# Patient Record
Sex: Female | Born: 1955
Health system: Southern US, Community
[De-identification: ages and names within clinical notes are randomized; demographics above are authoritative.]

## PROBLEM LIST (undated history)

## (undated) DIAGNOSIS — I10 Essential (primary) hypertension: Secondary | ICD-10-CM

## (undated) DIAGNOSIS — G4730 Sleep apnea, unspecified: Secondary | ICD-10-CM

## (undated) DIAGNOSIS — J309 Allergic rhinitis, unspecified: Secondary | ICD-10-CM

## (undated) DIAGNOSIS — R9431 Abnormal electrocardiogram [ECG] [EKG]: Secondary | ICD-10-CM

## (undated) DIAGNOSIS — E781 Pure hyperglyceridemia: Secondary | ICD-10-CM

## (undated) DIAGNOSIS — M129 Arthropathy, unspecified: Secondary | ICD-10-CM

## (undated) DIAGNOSIS — E119 Type 2 diabetes mellitus without complications: Secondary | ICD-10-CM

## (undated) HISTORY — DX: Sleep apnea, unspecified: G47.30

## (undated) HISTORY — DX: Pure hyperglyceridemia: E78.1

## (undated) HISTORY — PX: NASAL POLYP SURGERY: SHX186

## (undated) HISTORY — DX: Essential (primary) hypertension: I10

## (undated) HISTORY — DX: Arthropathy, unspecified: M12.9

## (undated) HISTORY — DX: Type 2 diabetes mellitus without complications: E11.9

## (undated) HISTORY — PX: HAND TENDON SURGERY: SHX663

## (undated) HISTORY — DX: Allergic rhinitis, unspecified: J30.9

## (undated) HISTORY — PX: ABDOMINAL HYSTERECTOMY: SHX81

## (undated) HISTORY — DX: Abnormal electrocardiogram (ECG) (EKG): R94.31

---

## 1999-05-19 ENCOUNTER — Other Ambulatory Visit: Admission: RE | Admit: 1999-05-19 | Discharge: 1999-05-19 | Payer: Self-pay | Admitting: Obstetrics and Gynecology

## 1999-05-24 ENCOUNTER — Ambulatory Visit (HOSPITAL_COMMUNITY): Admission: RE | Admit: 1999-05-24 | Discharge: 1999-05-24 | Payer: Self-pay | Admitting: Obstetrics and Gynecology

## 1999-05-24 ENCOUNTER — Encounter: Payer: Self-pay | Admitting: Obstetrics and Gynecology

## 1999-07-03 ENCOUNTER — Encounter: Payer: Self-pay | Admitting: Obstetrics and Gynecology

## 1999-07-03 ENCOUNTER — Ambulatory Visit (HOSPITAL_COMMUNITY): Admission: RE | Admit: 1999-07-03 | Discharge: 1999-07-03 | Payer: Self-pay | Admitting: Obstetrics and Gynecology

## 1999-08-14 ENCOUNTER — Encounter: Payer: Self-pay | Admitting: Obstetrics and Gynecology

## 1999-08-14 ENCOUNTER — Ambulatory Visit (HOSPITAL_COMMUNITY): Admission: RE | Admit: 1999-08-14 | Discharge: 1999-08-14 | Payer: Self-pay | Admitting: Obstetrics and Gynecology

## 1999-12-22 ENCOUNTER — Other Ambulatory Visit: Admission: RE | Admit: 1999-12-22 | Discharge: 1999-12-22 | Payer: Self-pay | Admitting: Obstetrics and Gynecology

## 2000-07-03 ENCOUNTER — Other Ambulatory Visit: Admission: RE | Admit: 2000-07-03 | Discharge: 2000-07-03 | Payer: Self-pay | Admitting: Obstetrics and Gynecology

## 2000-07-12 ENCOUNTER — Encounter: Payer: Self-pay | Admitting: Obstetrics and Gynecology

## 2000-07-12 ENCOUNTER — Ambulatory Visit (HOSPITAL_COMMUNITY): Admission: RE | Admit: 2000-07-12 | Discharge: 2000-07-12 | Payer: Self-pay | Admitting: Obstetrics and Gynecology

## 2001-07-04 ENCOUNTER — Other Ambulatory Visit: Admission: RE | Admit: 2001-07-04 | Discharge: 2001-07-04 | Payer: Self-pay | Admitting: Obstetrics and Gynecology

## 2001-07-11 ENCOUNTER — Encounter: Payer: Self-pay | Admitting: Obstetrics and Gynecology

## 2001-07-11 ENCOUNTER — Ambulatory Visit (HOSPITAL_COMMUNITY): Admission: RE | Admit: 2001-07-11 | Discharge: 2001-07-11 | Payer: Self-pay | Admitting: Obstetrics and Gynecology

## 2001-07-23 ENCOUNTER — Encounter (INDEPENDENT_AMBULATORY_CARE_PROVIDER_SITE_OTHER): Payer: Self-pay

## 2001-07-23 ENCOUNTER — Observation Stay (HOSPITAL_COMMUNITY): Admission: RE | Admit: 2001-07-23 | Discharge: 2001-07-24 | Payer: Self-pay | Admitting: Obstetrics and Gynecology

## 2003-09-14 ENCOUNTER — Emergency Department (HOSPITAL_COMMUNITY): Admission: EM | Admit: 2003-09-14 | Discharge: 2003-09-14 | Payer: Self-pay | Admitting: Emergency Medicine

## 2006-04-14 ENCOUNTER — Emergency Department (HOSPITAL_COMMUNITY): Admission: EM | Admit: 2006-04-14 | Discharge: 2006-04-14 | Payer: Self-pay | Admitting: Emergency Medicine

## 2006-08-13 ENCOUNTER — Encounter: Payer: Self-pay | Admitting: Internal Medicine

## 2006-08-13 LAB — CONVERTED CEMR LAB
ALT: 29 units/L
Albumin: 4.5 g/dL
BUN: 10 mg/dL
Calcium: 9.6 mg/dL
Cholesterol: 231 mg/dL
Creatinine, Ser: 0.82 mg/dL
Glucose, Bld: 157 mg/dL
HCT: 42.3 %
Hemoglobin: 14.3 g/dL
MCV: 94.2 fL
Monocytes Relative: 8 %
Potassium: 4.2 meq/L
RBC: 4.49 M/uL
RDW: 14.8 %
Total Bilirubin: 0.4 mg/dL
Total Protein: 7.3 g/dL
Triglyceride fasting, serum: 247 mg/dL

## 2006-08-22 ENCOUNTER — Encounter: Payer: Self-pay | Admitting: Internal Medicine

## 2007-01-06 ENCOUNTER — Emergency Department (HOSPITAL_COMMUNITY): Admission: EM | Admit: 2007-01-06 | Discharge: 2007-01-07 | Payer: Self-pay | Admitting: Emergency Medicine

## 2007-01-27 ENCOUNTER — Encounter: Payer: Self-pay | Admitting: Internal Medicine

## 2007-03-07 ENCOUNTER — Emergency Department (HOSPITAL_COMMUNITY): Admission: EM | Admit: 2007-03-07 | Discharge: 2007-03-07 | Payer: Self-pay | Admitting: Emergency Medicine

## 2007-05-01 LAB — HM MAMMOGRAPHY

## 2007-06-02 ENCOUNTER — Encounter: Admission: RE | Admit: 2007-06-02 | Discharge: 2007-06-02 | Payer: Self-pay | Admitting: Occupational Medicine

## 2008-07-15 ENCOUNTER — Encounter: Payer: Self-pay | Admitting: Internal Medicine

## 2008-07-15 LAB — CONVERTED CEMR LAB
AST: 17 units/L
Alkaline Phosphatase: 67 units/L
Basophils Relative: 0 %
CO2: 24 meq/L
Chloride: 106 meq/L
Creatinine, Ser: 0.85 mg/dL
HDL: 52 mg/dL
Hgb A1c MFr Bld: 5.6 %
LDL Cholesterol: 58 mg/dL
Lymphocytes, automated: 30 %
MCV: 97.5 fL
Neutrophils Relative %: 58 %
Platelets: 305 10*3/uL
Potassium: 4.1 meq/L
RBC: 3.98 M/uL
RDW: 13.9 %
Sodium: 142 meq/L
TSH: 1.151 microintl units/mL
Triglyceride fasting, serum: 251 mg/dL
WBC: 13.6 10*3/uL

## 2008-08-20 ENCOUNTER — Encounter: Payer: Self-pay | Admitting: Internal Medicine

## 2008-08-20 LAB — HM COLONOSCOPY

## 2010-02-04 ENCOUNTER — Observation Stay (HOSPITAL_COMMUNITY): Admission: EM | Admit: 2010-02-04 | Discharge: 2010-02-06 | Payer: Self-pay | Admitting: Emergency Medicine

## 2010-02-04 ENCOUNTER — Ambulatory Visit: Payer: Self-pay | Admitting: Internal Medicine

## 2010-02-04 DIAGNOSIS — R079 Chest pain, unspecified: Secondary | ICD-10-CM | POA: Insufficient documentation

## 2010-02-05 ENCOUNTER — Encounter: Payer: Self-pay | Admitting: Internal Medicine

## 2010-02-05 LAB — CONVERTED CEMR LAB
HDL: 36 mg/dL
Hgb A1c MFr Bld: 6.5 %
LDL Cholesterol: 54 mg/dL
Triglyceride fasting, serum: 392 mg/dL

## 2010-02-06 ENCOUNTER — Encounter: Payer: Self-pay | Admitting: Internal Medicine

## 2010-02-06 LAB — CONVERTED CEMR LAB
BUN: 15 mg/dL
Calcium: 9 mg/dL
Glucose, Bld: 145 mg/dL
Platelets: 285 10*3/uL
Potassium: 3.7 meq/L
RDW: 14.5 %
WBC: 10.7 10*3/uL

## 2010-02-07 ENCOUNTER — Encounter: Payer: Self-pay | Admitting: Internal Medicine

## 2010-02-07 ENCOUNTER — Ambulatory Visit: Payer: Self-pay | Admitting: Internal Medicine

## 2010-02-07 ENCOUNTER — Ambulatory Visit: Payer: Self-pay

## 2010-02-07 ENCOUNTER — Ambulatory Visit (HOSPITAL_COMMUNITY): Admission: RE | Admit: 2010-02-07 | Discharge: 2010-02-07 | Payer: Self-pay | Admitting: Internal Medicine

## 2010-02-07 ENCOUNTER — Telehealth (INDEPENDENT_AMBULATORY_CARE_PROVIDER_SITE_OTHER): Payer: Self-pay | Admitting: Radiology

## 2010-02-07 DIAGNOSIS — I70219 Atherosclerosis of native arteries of extremities with intermittent claudication, unspecified extremity: Secondary | ICD-10-CM | POA: Insufficient documentation

## 2010-02-08 ENCOUNTER — Ambulatory Visit: Payer: Self-pay | Admitting: Cardiovascular Disease

## 2010-02-08 ENCOUNTER — Ambulatory Visit: Payer: Self-pay

## 2010-02-08 ENCOUNTER — Encounter: Payer: Self-pay | Admitting: Internal Medicine

## 2010-02-08 ENCOUNTER — Encounter: Payer: Self-pay | Admitting: Cardiovascular Disease

## 2010-02-08 ENCOUNTER — Encounter (HOSPITAL_COMMUNITY)
Admission: RE | Admit: 2010-02-08 | Discharge: 2010-02-24 | Payer: Self-pay | Source: Home / Self Care | Admitting: Internal Medicine

## 2010-02-08 DIAGNOSIS — G4730 Sleep apnea, unspecified: Secondary | ICD-10-CM | POA: Insufficient documentation

## 2010-02-09 ENCOUNTER — Encounter: Payer: Self-pay | Admitting: Internal Medicine

## 2010-02-09 ENCOUNTER — Telehealth: Payer: Self-pay | Admitting: Internal Medicine

## 2010-02-22 ENCOUNTER — Ambulatory Visit: Payer: Self-pay | Admitting: Internal Medicine

## 2010-02-23 ENCOUNTER — Encounter: Payer: Self-pay | Admitting: Internal Medicine

## 2010-02-23 DIAGNOSIS — E1165 Type 2 diabetes mellitus with hyperglycemia: Secondary | ICD-10-CM | POA: Insufficient documentation

## 2010-02-28 ENCOUNTER — Ambulatory Visit: Payer: Self-pay | Admitting: Internal Medicine

## 2010-02-28 DIAGNOSIS — E781 Pure hyperglyceridemia: Secondary | ICD-10-CM | POA: Insufficient documentation

## 2010-02-28 DIAGNOSIS — I10 Essential (primary) hypertension: Secondary | ICD-10-CM | POA: Insufficient documentation

## 2010-02-28 DIAGNOSIS — J309 Allergic rhinitis, unspecified: Secondary | ICD-10-CM | POA: Insufficient documentation

## 2010-02-28 DIAGNOSIS — F17200 Nicotine dependence, unspecified, uncomplicated: Secondary | ICD-10-CM | POA: Insufficient documentation

## 2010-02-28 DIAGNOSIS — M1990 Unspecified osteoarthritis, unspecified site: Secondary | ICD-10-CM | POA: Insufficient documentation

## 2010-03-30 ENCOUNTER — Encounter: Payer: Self-pay | Admitting: Internal Medicine

## 2010-05-29 ENCOUNTER — Ambulatory Visit
Admission: RE | Admit: 2010-05-29 | Discharge: 2010-05-29 | Payer: Self-pay | Source: Home / Self Care | Attending: Internal Medicine | Admitting: Internal Medicine

## 2010-05-29 ENCOUNTER — Encounter (INDEPENDENT_AMBULATORY_CARE_PROVIDER_SITE_OTHER): Payer: Self-pay | Admitting: *Deleted

## 2010-05-29 DIAGNOSIS — F419 Anxiety disorder, unspecified: Secondary | ICD-10-CM | POA: Insufficient documentation

## 2010-05-30 NOTE — Progress Notes (Signed)
Summary: calling re rtn to work note  Phone Note Call from Patient   Caller: Patient  [phone] Reason for Call: Talk to Nurse Summary of Call: pt calling re note to rtn to work, when to go back? Needs faxed [phone]  att stephanie rogers Initial call taken by: Glynda Jaeger,  February 09, 2010 8:19 AM  Follow-up for Phone Call        pt adv she has note from hospital stay releasing back to work on 10.12.2011. however, she was here all day for stress test. will fax note to her work stating pt was here att.  Follow-up by: Claris Gladden RN,  February 09, 2010 9:59 AM

## 2010-05-30 NOTE — Assessment & Plan Note (Signed)
Summary: Cardiology Nuclear Testing  Nuclear Med Background Indications for Stress Test: Evaluation for Ischemia, Post Hospital, Abnormal EKG  Indications Comments: D/C 02/06/10 [facility] CP/EKG changes (-) enzymes   History Comments: D/C 02/06/10 [facility]  Symptoms: Chest Pressure, DOE, Fatigue, Palpitations, SOB    Nuclear Pre-Procedure Cardiac Risk Factors: Claudication, Family History - CAD, Hypertension, Lipids, NIDDM, PVD, Smoker Caffeine/Decaff Intake: None NPO After: 7:30 AM Lungs: clear IV 0.9% NS with Angio Cath: 22g     IV Site: R Hand IV Started by: Bonnita Levan, RN Chest Size (in) 38     Cup Size C     Height (in): 65 Weight (lb): 162 BMI: 27.06 Tech Comments: This patient walked on the treadmill and had to be switched to a walking Lexiscan. She was unable to keep up with the treadmill and couldn't get her HR up. The patient experienced chest tightness and sob before the Lexiscan was given.  The DOD Dr. Algis Greenhouse was consulted about this patient and said to leave the reader a note to make sure he found it to be ok. The patient was pain free and sent home. She was advised if she had any more problems to go to the hospital.   Nuclear Med Study 1 or 2 day study:  1 day     Stress Test Type:  Treadmill/Lexiscan Reading MD:  Charlton Haws, MD     Referring MD:  S.Klein Resting Radionuclide:  Technetium 14m Tetrofosmin     Resting Radionuclide Dose:  11.0 mCi  Stress Radionuclide:  Technetium 71m Tetrofosmin     Stress Radionuclide Dose:  33.0 mCi   Stress Protocol Exercise Time (min):  7:00 min     Max HR:  115 bpm     Predicted Max HR:  165 bpm  Max Systolic BP: 195 mm Hg     Percent Max HR:  69.70 %     METS: 7.0 Rate Pressure Product:  81191  Lexiscan: 0.4 mg   Stress Test Technologist:  Milana Na, EMT-P     Nuclear Technologist:  Doyne Keel, CNMT  Rest Procedure  Myocardial perfusion imaging was performed at rest 45 minutes following the intravenous administration of  Technetium 69m Tetrofosmin.  Stress Procedure  The patient received IV Lexiscan 0.4 mg over 15-seconds with concurrent low level exercise and then Technetium 18m Tetrofosmin was injected at 30-seconds while the patient continued walking one more minute.  There were + significant changes, sob, chest tightness, and occ pvcs with Lexiscan.  Quantitative spect images were obtained after a 45 minute delay.  QPS Raw Data Images:  Normal; no motion artifact; normal heart/lung ratio. Stress Images:  Normal homogeneous uptake in all areas of the myocardium. Rest Images:  Normal homogeneous uptake in all areas of the myocardium. Subtraction (SDS):  Normal Transient Ischemic Dilatation:  0.94  (Normal <1.22)  Lung/Heart Ratio:  0.32  (Normal <0.45)  Quantitative Gated Spect Images QGS EDV:  95 ml QGS ESV:  37 ml QGS EF:  61 % QGS cine images:  normal  Findings Normal nuclear study      Overall Impression  Exercise Capacity: Fair exercise capacity. BP Response: Normal blood pressure response. Clinical Symptoms: chest pain ECG Impression: Baseline LVH with strain and marked inferolateral T wave inversions Overall Impression: Normal stress nuclear study.         Appended Document: Cardiology Nuclear Testing adv pt results normal. Claris Gladden, RN, BSN

## 2010-05-30 NOTE — Miscellaneous (Signed)
Summary: Orders Update-sleep study  Clinical Lists Changes  Problems: Added new problem of SLEEP APNEA (ICD-780.57) Orders: Added new Referral order of Sleep Disorder Referral (Sleep Disorder) - Signed

## 2010-05-30 NOTE — Progress Notes (Signed)
Summary: NUC PRE-PROCEDURE  Phone Note Outgoing Call   Call placed by: Domenic Polite, CNMT,  February 07, 2010 11:05 AM Call placed to: Patient Reason for Call: Confirm/change Appt Summary of Call: Left message with information on [employer] Information Sheet (see scanned document for details).      Nuclear Med Background    History Comments: D/C 02/06/10 [facility]  Symptoms: Chest Pressure    Nuclear Pre-Procedure Cardiac Risk Factors: Claudication, Family History - CAD, Hypertension, Lipids, NIDDM, PVD, Smoker

## 2010-05-30 NOTE — Miscellaneous (Signed)
Summary: Orders Update  Clinical Lists Changes  Problems: Added new problem of ATHEROSLERO NATV ART EXTREM W/INTERMIT CLAUDICAT (ICD-440.21) Orders: Added new Test order of Arterial Duplex Lower Extremity (Arterial Duplex Low) - Signed 

## 2010-05-30 NOTE — Assessment & Plan Note (Signed)
Summary: New/[employer]/#/cd   Vital Signs:  Patient profile:   55 year old female Height:      65 inches Weight:      167.8 pounds O2 Sat:      97 % on Room air Temp:     98.8 degrees F oral Pulse rate:   66 / minute BP sitting:   138 / 76  (left arm) Cuff size:   regular  Vitals Entered By: Orlan Leavens RMA (February 28, 2010 2:12 PM)  O2 Flow:  Room air CC: New patient Is Patient Diabetic? Yes Did you bring your meter with you today? No Pain Assessment Patient in pain? no      Comments Pt has not started on Trilipix want to know should she be taking. Also need refills on all meds   Primary Care Provider:  Newt Lukes MD  CC:  New patient.  History of Present Illness: new pt to me and our division, here to est care  1) recent hosp 01/2010 for CP - s/p cardiac eval - noncardiac etiology - pt attributes to stress and anxiety  2) HTN - long hx same, variable control on current meds- reports compliance with ongoing medical treatment and no changes in medication dose or frequency. denies adverse side effects related to current therapy.   3) dyslipidemia, esp high TGs - has not yet begun trilipix but cont statin as prev rx'd - reports compliance with ongoing medical treatment and no changes in medication dose or frequency. denies adverse side effects related to current therapy.   4) DM2, diet controlled at this time - remotely on "sugar pill" but caused her to be ill - dx reconfirmed 01/2010 hosp - following low sugar/ low carb diet - no regular exercise or nutritionist meeting  5) depression and anxiety - onset 2008 - precipitated by financial problems - "very stressed out" - and uses xanax as needed - never on other meds or counseling - not worse  Preventive Screening-Counseling & Management  Alcohol-Tobacco     Alcohol drinks/day: <1     Alcohol Counseling: not indicated; use of alcohol is not excessive or problematic     Smoking Status: current     Smoking Cessation  Counseling: yes     Tobacco Counseling: to quit use of tobacco products  Caffeine-Diet-Exercise     Exercise Counseling: to improve exercise regimen     Depression Counseling: not indicated; screening negative for depression  Safety-Violence-Falls     Seat Belt Counseling: not indicated; patient wears seat belts     Helmet Counseling: not indicated; patient wears helmet when riding bicycle/motocycle     Violence Counseling: not indicated; no violence risk noted     Fall Risk Counseling: not indicated; no significant falls noted  Clinical Review Panels:  Prevention   Last Mammogram:  No specific mammographic evidence of malignancy.   (05/01/2007)   Last Pap Smear:  Interpretation Result:Negative for intraepithelial Lesion or Malignancy.    (04/30/2006)   Last Colonoscopy:  Pt states results was normal (04/30/2008)  Immunizations   Last Tetanus Booster:  Historical (05/01/2007)  Lipid Management   Cholesterol:  168 (02/05/2010)   LDL (bad choesterol):  54 (02/05/2010)   HDL (good cholesterol):  36 (02/05/2010)   Triglycerides:  392 (02/05/2010)  Diabetes Management   HgBA1C:  6.5 (02/05/2010)   Creatinine:  0.88 (02/06/2010)  CBC   WBC:  10.7 (02/06/2010)   RBC:  3.99 (02/06/2010)   Hgb:  12.8 (02/06/2010)  Hct:  37.9 (02/06/2010)   Platelets:  285 (02/06/2010)   MCV  94.8 (02/06/2010)   RDW  14.5 (02/06/2010)  Complete Metabolic Panel   Glucose:  145 (02/06/2010)   Sodium:  140 (02/06/2010)   Potassium:  3.7 (02/06/2010)   Chloride:  108 (02/06/2010)   CO2:  26 (02/06/2010)   BUN:  15 (02/06/2010)   Creatinine:  0.88 (02/06/2010)   Calcium:  9.0 (02/06/2010)   Contraindications/Deferment of Procedures/Staging:    Test/Procedure: FLU VAX    Reason for deferment: patient declined   Current Medications (verified): 1)  Amlodipine Besylate 5 Mg Tabs (Amlodipine Besylate) .... Take 1 By Mouth Once Daily 2)  Aspirin 81 Mg Tbec (Aspirin) .... Take 1 By Mouth  Once Daily 3)  Xanax 0.5 Mg Tabs (Alprazolam) .... Take 1 Every 8 Hours As Needed For Anxiety 4)  Carvedilol 6.25 Mg Tabs (Carvedilol) .... Take 1 Two Times A Day 5)  Hydrochlorothiazide 12.5 Mg Caps (Hydrochlorothiazide) .... Take 1 By Mouth Once Daily 6)  Lisinopril 40 Mg Tabs (Lisinopril) .... Take 1 By Mouth Once Daily 7)  Pravastatin Sodium 20 Mg Tabs (Pravastatin Sodium) .... Take 1 By Mouth Once Daily 8)  Trilipix 45 Mg Cpdr (Choline Fenofibrate) .... Take 1 By Mouth Once Daily  Allergies (verified): No Known Drug Allergies  Past History:  Past Medical History: Hypertension Hypertriglyceridemia Sleep apnea Abnormal electrocardiogram consistent with LVH hypertrophy Diabetes mellitus, type II Allergic rhinitis  Past Surgical History:  Nasal polyp surgery   Hysterectomy  Tendon surgery in her hand     Family History:  Negative for early cardiac death Family History High cholesterol (parent) Family History Hypertension (parent)  Family History Ovarian cancer (parent) Family History Diabetes 1st degree relative (parent)  Social History: She is married    She has 1 child and 3 grandchildren She works at a [employer] She smokes about a pack a day She uses alcohol Denies use of recreational drugsSmoking Status:  current  Review of Systems       see HPI above. I have reviewed all other systems and they were negative.   Physical Exam  General:  alert, well-developed, well-nourished, and cooperative to examination.    Head:  Normocephalic and atraumatic without obvious abnormalities. No apparent alopecia or balding. Eyes:  vision grossly intact; pupils equal, round and reactive to light.  conjunctiva and lids normal.    Ears:  normal pinnae bilaterally, without erythema, swelling, or tenderness to palpation. TMs clear, without effusion, or cerumen impaction. Hearing grossly normal bilaterally  Mouth:  teeth and gums in good repair; mucous membranes  moist, without lesions or ulcers. oropharynx clear without exudate, no erythema.  Neck:  supple, full ROM, no masses, no thyromegaly; no thyroid nodules or tenderness. no JVD or carotid bruits.   Lungs:  normal respiratory effort, no intercostal retractions or use of accessory muscles; normal breath sounds bilaterally - no crackles and no wheezes.    Heart:  normal rate, regular rhythm, no murmur, and no rub. BLE without edema. normal DP pulses and normal cap refill in all 4 extremities    Abdomen:  soft, non-tender, normal bowel sounds, no distention; no masses and no appreciable hepatomegaly or splenomegaly.   Genitalia:  defer to gyn Msk:  No deformity or scoliosis noted of thoracic or lumbar spine.   Neurologic:  alert & oriented X3 and cranial nerves II-XII symetrically intact.  strength normal in all extremities, sensation intact to light touch, and gait  normal. speech fluent without dysarthria or aphasia; follows commands with good comprehension.  Skin:  no rashes, vesicles, ulcers, or erythema. No nodules or irregularity to palpation.  Psych:  Oriented X3, memory intact for recent and remote, normally interactive, good eye contact, not anxious appearing, not depressed appearing, and not agitated.      Impression & Recommendations:  Problem # 1:  HYPERTENSION (ICD-401.9)  Her updated medication list for this problem includes:    Amlodipine Besylate 5 Mg Tabs (Amlodipine besylate) .Marland Kitchen... Take 1 by mouth once daily    Carvedilol 6.25 Mg Tabs (Carvedilol) .Marland Kitchen... Take 1 two times a day    Hydrochlorothiazide 12.5 Mg Caps (Hydrochlorothiazide) .Marland Kitchen... Take 1 by mouth once daily    Lisinopril 40 Mg Tabs (Lisinopril) .Marland Kitchen... Take 1 by mouth once daily  BP today: 138/76  Labs Reviewed: K+: 3.7 (02/06/2010) Creat: : 0.88 (02/06/2010)   Chol: 168 (02/05/2010)   HDL: 36 (02/05/2010)   LDL: 54 (02/05/2010)   TG: 392 (02/05/2010)  Orders: Tobacco use cessation intermediate 3-10 minutes  (99406)  Problem # 2:  HYPERTRIGLYCERIDEMIA (ICD-272.1)  has not begun fenofibrate - plans to work on weight loss and exercise recheck next OV and tx as needed   Her updated medication list for this problem includes:    Pravastatin Sodium 20 Mg Tabs (Pravastatin sodium) .Marland Kitchen... Take 1 by mouth once daily    Trilipix 45 Mg Cpdr (Choline fenofibrate) ..... On hold (never started since 01/2010 hosp)    HDL:36 (02/05/2010)  LDL:54 (02/05/2010)  Chol:168 (02/05/2010)  Trig:392 (02/05/2010)  Problem # 3:  DIABETES MELLITUS, TYPE II (ICD-250.00)  rec weight control and exercise + diet -  remotely on pills but unsure what - causes her to feel ill - will send for prior records to review Her updated medication list for this problem includes:    Aspirin 81 Mg Tbec (Aspirin) .Marland Kitchen... Take 1 by mouth once daily    Lisinopril 40 Mg Tabs (Lisinopril) .Marland Kitchen... Take 1 by mouth once daily  Labs Reviewed: Creat: 0.88 (02/06/2010)    Reviewed HgBA1c results: 6.5 (02/05/2010)  Orders: Tobacco use cessation intermediate 3-10 minutes (99406)  Problem # 4:  SMOKER (ICD-305.1)  5 minutes today spent on patient education regarding the unhealthy effects of continued tobacco abuse and encouragment of cessation including medical options available to help patient to quit smoking.   Orders: Tobacco use cessation intermediate 3-10 minutes (99406)  Complete Medication List: 1)  Amlodipine Besylate 5 Mg Tabs (Amlodipine besylate) .... Take 1 by mouth once daily 2)  Aspirin 81 Mg Tbec (Aspirin) .... Take 1 by mouth once daily 3)  Xanax 0.5 Mg Tabs (Alprazolam) .... Take 1 every 8 hours as needed for anxiety 4)  Carvedilol 6.25 Mg Tabs (Carvedilol) .... Take 1 two times a day 5)  Hydrochlorothiazide 12.5 Mg Caps (Hydrochlorothiazide) .... Take 1 by mouth once daily 6)  Lisinopril 40 Mg Tabs (Lisinopril) .... Take 1 by mouth once daily 7)  Pravastatin Sodium 20 Mg Tabs (Pravastatin sodium) .... Take 1 by mouth once  daily 8)  Trilipix 45 Mg Cpdr (Choline fenofibrate) .... On hold (never started since 01/2010 hosp)  Patient Instructions: 1)  it was good to see you today. 2)  labs and recent hospitalization reviewed - no medication changes recommended at this time - refills done as discussed 3)  will hold on Tripilix at this time for triglycerides but plan to revisit next time we look at your cholesterol labs 4)  will  send for prior records from your doctore to review, especially about diabetes medication efforts in the past 5)  it is important that you work on losing weight - monitor your diet and consume fewer calories such as less carbohydrates (sugar) and less fat. you also need to increase your physical activity level - start by walking for 10-20 minutes 3 times per week and work up to 30 minutes 4-5 times each week.  6)  Please schedule a follow-up appointment in 3-6 months, call sooner if problems. will recheck diabetes and cholesterol labs at that time 7)  Tobacco is very bad for your health and your loved ones! You Should stop smoking! Prescriptions: PRAVASTATIN SODIUM 20 MG TABS (PRAVASTATIN SODIUM) take 1 by mouth once daily  #90 x 1   Entered and Authorized by:   Newt Lukes MD   Signed by:   Newt Lukes MD on 02/28/2010   Method used:   Electronically to        [facility] (retail)       [address]       [place], [place]  [address]       Ph: [phone]       Fax: [phone]   RxID:   [id number] LISINOPRIL 40 MG TABS (LISINOPRIL) take 1 by mouth once daily  #90 x 1   Entered and Authorized by:   Newt Lukes MD   Signed by:   Newt Lukes MD on 02/28/2010   Method used:   Electronically to        [facility] (retail)       [address]       [place], [place]  [address]       Ph: [phone]       Fax: [phone]   RxID:   [id number] HYDROCHLOROTHIAZIDE 12.5 MG CAPS  (HYDROCHLOROTHIAZIDE) take 1 by mouth once daily  #90 x 1   Entered and Authorized by:   Newt Lukes MD   Signed by:   Newt Lukes MD on 02/28/2010   Method used:   Electronically to        [facility] (retail)       [address]       [place], [place]  [address]       Ph: [phone]       Fax: [phone]   RxID:   [id number] CARVEDILOL 6.25 MG TABS (CARVEDILOL) take 1 two times a day  #180 x 1   Entered and Authorized by:   Newt Lukes MD   Signed by:   Newt Lukes MD on 02/28/2010   Method used:   Electronically to        [facility] (retail)       [address]       [place], [place]  [address]       Ph: [phone]       Fax: [phone]   RxID:   [id number] AMLODIPINE BESYLATE 5 MG TABS (AMLODIPINE BESYLATE) take 1 by mouth once daily  #90 x 1   Entered and Authorized by:   Newt Lukes MD   Signed by:   Newt Lukes MD on 02/28/2010   Method used:  Electronically to        [facility] (retail)       [address]       [place], [place]  [address]       Ph: [phone]       Fax: [phone]   RxID:   [id number] Prudy Feeler 0.5 MG TABS (ALPRAZOLAM) take 1 every 8 hours as needed for anxiety  #90 x 1   Entered and Authorized by:   Newt Lukes MD   Signed by:   Newt Lukes MD on 02/28/2010   Method used:   Print then Give to Patient   RxID:   [id number]    Orders Added: 1)  New Patient Level III [23557] 2)  Tobacco use cessation intermediate 3-10 minutes [99406]   Immunization History:  Tetanus/Td Immunization History:    Tetanus/Td:  historical (05/01/2007)   Immunization History:  Tetanus/Td Immunization History:    Tetanus/Td:  Historical (05/01/2007)    Pap Smear  Procedure date:  04/30/2006  Findings:      Interpretation Result:Negative for intraepithelial Lesion or Malignancy.      Mammogram  Procedure date:  05/01/2007  Findings:      No specific mammographic evidence of malignancy.    Colonoscopy  Procedure date:  04/30/2008  Findings:      Pt states results was normal

## 2010-05-30 NOTE — Letter (Signed)
Summary: Work Writer, Main Office  [address]   [place], [place] [address]   Phone: [phone]  Fax: [phone]     February 09, 2010    [facility]   The above named patient had a medical procedure on February 08, 2010.  Please take this into consideration when reviewing the time away from work.     Sincerely yours,  Architectural technologist

## 2010-06-01 NOTE — Procedures (Signed)
Summary: Colon/Patrick Jamelle Haring MD  Colon/Patrick Jamelle Haring MD   Imported By: Lester [place] 05/09/2010 07:22:48  _____________________________________________________________________  External Attachment:    Type:   Image     Comment:   External Document

## 2010-06-01 NOTE — Letter (Signed)
Summary: [facility]   Imported By: Lester [place] 05/09/2010 07:24:03  _____________________________________________________________________  External Attachment:    Type:   Image     Comment:   External Document

## 2010-06-07 NOTE — Assessment & Plan Note (Signed)
Summary: ANXIETY--ER/URG CARE IF WORSEN--STC   Vital Signs:  Patient profile:   55 year old female Height:      65 inches (165.10 cm) Weight:      165.4 pounds (75.18 kg) O2 Sat:      96 % on Room air Temp:     98.8 degrees F (37.11 degrees C) oral Pulse rate:   76 / minute BP sitting:   182 / 90  (left arm) Cuff size:   regular  Vitals Entered By: Orlan Leavens RMA (May 29, 2010 1:05 PM)  O2 Flow:  Room air CC: Anxiety Is Patient Diabetic? Yes Did you bring your meter with you today? No Pain Assessment Patient in pain? no        Primary Care Provider:  Newt Lukes MD  CC:  Anxiety.  History of Present Illness: c/o increase anxiety symptoms  hx same, worse in past few months c/o irrritability and short fuse, esp at work  also reviewed chronci med issues: 1) hosp 01/2010 for CP - s/p cardiac eval - noncardiac etiology - pt attributes to stress and anxiety  2) HTN - long hx same, variable control on current meds- reports compliance with ongoing medical treatment and no changes in medication dose or frequency. denies adverse side effects related to current therapy.   3) dyslipidemia, esp high TGs - rx'd trilipix 01/2010 hosp but never started, cont statin as prev rx'd - reports compliance with ongoing medical treatment and no changes in medication dose or frequency. denies adverse side effects related to current therapy.   4) DM2, diet controlled at this time - remotely on "sugar pill" but caused her to be ill - dx reconfirmed 01/2010 hosp - following low sugar/ low carb diet - no regular exercise or nutritionist meeting  5) depression and anxiety - onset 2008 - precipitated by financial problems - "very stressed out" - and uses xanax as needed - never on other meds or counseling - not worse  Clinical Review Panels:  Lipid Management   Cholesterol:  168 (02/05/2010)   LDL (bad choesterol):  54 (02/05/2010)   HDL (good cholesterol):  36 (02/05/2010)  Triglycerides:  392 (02/05/2010)  Diabetes Management   HgBA1C:  6.5 (02/05/2010)   Creatinine:  0.88 (02/06/2010)  CBC   WBC:  10.7 (02/06/2010)   RBC:  3.99 (02/06/2010)   Hgb:  12.8 (02/06/2010)   Hct:  37.9 (02/06/2010)   Platelets:  285 (02/06/2010)   MCV  94.8 (02/06/2010)   RDW  14.5 (02/06/2010)   PMN:  58 (07/15/2008)   Monos:  10 (07/15/2008)   Eosinophils:  2 (07/15/2008)   Basophil:  0 (07/15/2008)  Complete Metabolic Panel   Glucose:  145 (02/06/2010)   Sodium:  140 (02/06/2010)   Potassium:  3.7 (02/06/2010)   Chloride:  108 (02/06/2010)   CO2:  26 (02/06/2010)   BUN:  15 (02/06/2010)   Creatinine:  0.88 (02/06/2010)   Albumin:  4.5 (07/15/2008)   Total Protein:  7.3 (08/13/2006)   Calcium:  9.0 (02/06/2010)   Total Bili:  0.3 (07/15/2008)   Alk Phos:  67 (07/15/2008)   SGPT (ALT):  27 (07/15/2008)   SGOT (AST):  17 (07/15/2008)   Current Medications (verified): 1)  Amlodipine Besylate 5 Mg Tabs (Amlodipine Besylate) .... Take 1 By Mouth Once Daily 2)  Aspirin 81 Mg Tbec (Aspirin) .... Take 1 By Mouth Once Daily 3)  Xanax 0.5 Mg Tabs (Alprazolam) .... Take 1 Every 8 Hours As  Needed For Anxiety 4)  Carvedilol 6.25 Mg Tabs (Carvedilol) .... Take 1 Two Times A Day 5)  Hydrochlorothiazide 12.5 Mg Caps (Hydrochlorothiazide) .... Take 1 By Mouth Once Daily 6)  Lisinopril 40 Mg Tabs (Lisinopril) .... Take 1 By Mouth Once Daily 7)  Pravastatin Sodium 20 Mg Tabs (Pravastatin Sodium) .... Take 1 By Mouth Once Daily 8)  Trilipix 45 Mg Cpdr (Choline Fenofibrate) .... On Hold (Never Started Since 01/2010 Hosp)  Allergies (verified): No Known Drug Allergies  Past History:  Past Medical History: Hypertension Hypertriglyceridemia/dyslipidemia Sleep apnea Abnormal electrocardiogram consistent with LVH hypertrophy Diabetes mellitus, type II-diet controlled Allergic rhinitis anxiety  Review of Systems  The patient denies anorexia, fever, chest pain, syncope,  and headaches.    Physical Exam  General:  alert, well-developed, well-nourished, and cooperative to examination.    Lungs:  normal respiratory effort, no intercostal retractions or use of accessory muscles; normal breath sounds bilaterally - no crackles and no wheezes.    Heart:  normal rate, regular rhythm, no murmur, and no rub. BLE without edema. Psych:  Oriented X3, memory intact for recent and remote, normally interactive, good eye contact ,mildly anxious appearing, not depressed appearing, and not agitated.      Impression & Recommendations:  Problem # 1:  ANXIETY STATE, UNSPECIFIED (ICD-300.00) start daily ssri - cont as needed bz - erx and refill done refer to behav health -  work note done f/u 4-6 weeks, sooner of probs reassurance/counseling provided Her updated medication list for this problem includes:    Xanax 0.5 Mg Tabs (Alprazolam) .Marland Kitchen... Take 1 every 8 hours as needed for anxiety    Citalopram Hydrobromide 20 Mg Tabs (Citalopram hydrobromide) .Marland Kitchen... 1/2 by mouth once daily x 2 weeks, then increase to 1 by mouth once daily (or as directed)  Orders: Prescription Created Electronically [phone]) Psychology Referral (Psychology)  Complete Medication List: 1)  Amlodipine Besylate 5 Mg Tabs (Amlodipine besylate) .... Take 1 by mouth once daily 2)  Aspirin 81 Mg Tbec (Aspirin) .... Take 1 by mouth once daily 3)  Xanax 0.5 Mg Tabs (Alprazolam) .... Take 1 every 8 hours as needed for anxiety 4)  Carvedilol 6.25 Mg Tabs (Carvedilol) .... Take 1 two times a day 5)  Hydrochlorothiazide 12.5 Mg Caps (Hydrochlorothiazide) .... Take 1 by mouth once daily 6)  Lisinopril 40 Mg Tabs (Lisinopril) .... Take 1 by mouth once daily 7)  Pravastatin Sodium 20 Mg Tabs (Pravastatin sodium) .... Take 1 by mouth once daily 8)  Trilipix 45 Mg Cpdr (Choline fenofibrate) .... On hold (never started since 01/2010 hosp) 9)  Citalopram Hydrobromide 20 Mg Tabs (Citalopram hydrobromide) .... 1/2 by mouth  once daily x 2 weeks, then increase to 1 by mouth once daily (or as directed)  Patient Instructions: 1)  it was good to see you today. 2)  start daily citalopram and cont as needed alprazolam - your prescriptions/refills have been submitted to your pharmacy. Please take as directed. Contact our office if you believe you're having problems with the medication(s).  3)  we'll make referral to counseling. Our office will contact you regarding this appointment once made.  4)  Please schedule a follow-up appointment in 4-6 weeks to review symptoms and medications, call sooner if problems.  Prescriptions: XANAX 0.5 MG TABS (ALPRAZOLAM) take 1 every 8 hours as needed for anxiety  #90 x 1   Entered and Authorized by:   Newt Lukes MD   Signed by:   Newt Lukes  MD on 05/29/2010   Method used:   Printed then faxed to ...       [id number]* (retail)       [address] [place], [place]  [address]       Ph: [phone]       Fax: [phone]   RxID:   [id number] CITALOPRAM HYDROBROMIDE 20 MG TABS (CITALOPRAM HYDROBROMIDE) 1/2 by mouth once daily x 2 weeks, then increase to 1 by mouth once daily (or as directed)  #30 x 2   Entered and Authorized by:   Newt Lukes MD   Signed by:   Newt Lukes MD on 05/29/2010   Method used:   Electronically to        [id number]* (retail)       [address] [place], [place]  [address]       Ph: [phone]       Fax: [phone]   RxID:   [id number]    Orders Added: 1)  Est. Patient Level IV [[address]] 2)  Prescription Created Electronically [phone] 3)  Psychology Referral [Psychology]

## 2010-06-07 NOTE — Letter (Signed)
Summary: [facility]  [address] [place], [place] [address]   Phone: [phone]  Fax: [phone]    Today's Date: May 29, 2010  Name of Patient: Terry Hull  The above named patient had a medical visit today 05/29/10, and is being treating for anxiety/stress disorder.Please take this into consideration when reviewing the time away from work. Special Instructions:    Sincerely yours,   Dr. Rene Paci

## 2010-06-12 ENCOUNTER — Ambulatory Visit: Payer: PRIVATE HEALTH INSURANCE | Admitting: Psychiatry

## 2010-06-12 ENCOUNTER — Encounter: Payer: Self-pay | Admitting: Internal Medicine

## 2010-06-12 DIAGNOSIS — F329 Major depressive disorder, single episode, unspecified: Secondary | ICD-10-CM

## 2010-06-12 DIAGNOSIS — Z630 Problems in relationship with spouse or partner: Secondary | ICD-10-CM

## 2010-06-21 NOTE — Letter (Signed)
Summary: Enzo Bi PsyD  Enzo Bi PsyD   Imported By: Lester Landfall 06/16/2010 10:47:18  _____________________________________________________________________  External Attachment:    Type:   Image     Comment:   External Document

## 2010-06-26 ENCOUNTER — Ambulatory Visit: Payer: PRIVATE HEALTH INSURANCE | Admitting: Psychiatry

## 2010-07-13 LAB — CARDIAC PANEL(CRET KIN+CKTOT+MB+TROPI)
CK, MB: 1.1 ng/mL (ref 0.3–4.0)
CK, MB: 1.2 ng/mL (ref 0.3–4.0)
CK, MB: 1.2 ng/mL (ref 0.3–4.0)
Relative Index: INVALID (ref 0.0–2.5)
Relative Index: INVALID (ref 0.0–2.5)
Total CK: 52 U/L (ref 7–177)
Total CK: 57 U/L (ref 7–177)
Total CK: 57 U/L (ref 7–177)

## 2010-07-13 LAB — CBC
HCT: 37.5 % (ref 36.0–46.0)
HCT: 37.9 % (ref 36.0–46.0)
HCT: 39.7 % (ref 36.0–46.0)
Hemoglobin: 12.8 g/dL (ref 12.0–15.0)
Hemoglobin: 12.9 g/dL (ref 12.0–15.0)
Hemoglobin: 13.6 g/dL (ref 12.0–15.0)
MCH: 31.9 pg (ref 26.0–34.0)
MCH: 32.1 pg (ref 26.0–34.0)
MCH: 32.2 pg (ref 26.0–34.0)
MCHC: 33.9 g/dL (ref 30.0–36.0)
MCHC: 34.2 g/dL (ref 30.0–36.0)
MCHC: 34.3 g/dL (ref 30.0–36.0)
MCV: 93.3 fL (ref 78.0–100.0)
MCV: 93.6 fL (ref 78.0–100.0)
MCV: 94.8 fL (ref 78.0–100.0)
Platelets: 276 10*3/uL (ref 150–400)
Platelets: 285 10*3/uL (ref 150–400)
RBC: 4.01 MIL/uL (ref 3.87–5.11)
RBC: 4.25 MIL/uL (ref 3.87–5.11)
RDW: 14.2 % (ref 11.5–15.5)
RDW: 14.3 % (ref 11.5–15.5)
RDW: 14.5 % (ref 11.5–15.5)
WBC: 10.4 10*3/uL (ref 4.0–10.5)
WBC: 12.3 10*3/uL — ABNORMAL HIGH (ref 4.0–10.5)

## 2010-07-13 LAB — GLUCOSE, CAPILLARY
Glucose-Capillary: 124 mg/dL — ABNORMAL HIGH (ref 70–99)
Glucose-Capillary: 159 mg/dL — ABNORMAL HIGH (ref 70–99)
Glucose-Capillary: 199 mg/dL — ABNORMAL HIGH (ref 70–99)

## 2010-07-13 LAB — LIPID PANEL
HDL: 36 mg/dL — ABNORMAL LOW (ref >39)
Triglycerides: 392 mg/dL — ABNORMAL HIGH (ref <150)
VLDL: 78 mg/dL — ABNORMAL HIGH (ref 0–40)

## 2010-07-13 LAB — URINALYSIS, ROUTINE W REFLEX MICROSCOPIC
Bilirubin Urine: NEGATIVE
Glucose, UA: NEGATIVE mg/dL
Ketones, ur: NEGATIVE mg/dL
Leukocytes, UA: NEGATIVE
Nitrite: NEGATIVE
Protein, ur: NEGATIVE mg/dL
Specific Gravity, Urine: 1.015 (ref 1.005–1.030)
Urobilinogen, UA: 0.2 mg/dL (ref 0.0–1.0)
pH: 7 (ref 5.0–8.0)

## 2010-07-13 LAB — BASIC METABOLIC PANEL
BUN: 15 mg/dL (ref 6–23)
CO2: 26 mEq/L (ref 19–32)
Glucose, Bld: 145 mg/dL — ABNORMAL HIGH (ref 70–99)
Potassium: 3.7 mEq/L (ref 3.5–5.1)
Sodium: 140 mEq/L (ref 135–145)

## 2010-07-13 LAB — BASIC METABOLIC PANEL WITH GFR
BUN: 13 mg/dL (ref 6–23)
BUN: 14 mg/dL (ref 6–23)
CO2: 26 meq/L (ref 19–32)
CO2: 32 meq/L (ref 19–32)
Calcium: 9 mg/dL (ref 8.4–10.5)
Calcium: 9.5 mg/dL (ref 8.4–10.5)
Chloride: 105 meq/L (ref 96–112)
Chloride: 109 meq/L (ref 96–112)
Creatinine, Ser: 0.82 mg/dL (ref 0.4–1.2)
Creatinine, Ser: 0.86 mg/dL (ref 0.4–1.2)
GFR calc non Af Amer: >60 mL/min
GFR calc non Af Amer: >60 mL/min
Glucose, Bld: 163 mg/dL — ABNORMAL HIGH (ref 70–99)
Glucose, Bld: 177 mg/dL — ABNORMAL HIGH (ref 70–99)
Potassium: 3.7 meq/L (ref 3.5–5.1)
Potassium: 3.9 meq/L (ref 3.5–5.1)
Sodium: 140 meq/L (ref 135–145)
Sodium: 141 meq/L (ref 135–145)

## 2010-07-13 LAB — URINE CULTURE
Colony Count: >=100000
Culture  Setup Time: 201110081808

## 2010-07-13 LAB — DIFFERENTIAL
Basophils Absolute: 0 10*3/uL (ref 0.0–0.1)
Basophils Relative: 0 % (ref 0–1)
Eosinophils Absolute: 0.2 10*3/uL (ref 0.0–0.7)
Eosinophils Relative: 2 % (ref 0–5)
Lymphocytes Relative: 24 % (ref 12–46)
Lymphs Abs: 3 10*3/uL (ref 0.7–4.0)
Monocytes Absolute: 1.4 10*3/uL — ABNORMAL HIGH (ref 0.1–1.0)
Monocytes Relative: 11 % (ref 3–12)
Neutro Abs: 7.7 10*3/uL (ref 1.7–7.7)
Neutrophils Relative %: 63 % (ref 43–77)

## 2010-07-13 LAB — URINE MICROSCOPIC-ADD ON

## 2010-07-13 LAB — TSH: TSH: 1.212 u[IU]/mL (ref 0.350–4.500)

## 2010-07-13 LAB — HEMOGLOBIN A1C: Mean Plasma Glucose: 140 mg/dL — ABNORMAL HIGH (ref <117)

## 2010-07-13 LAB — POCT CARDIAC MARKERS: Troponin i, poc: <0.05 ng/mL (ref 0.00–0.09)

## 2010-08-22 ENCOUNTER — Other Ambulatory Visit: Payer: Self-pay | Admitting: Internal Medicine

## 2010-08-30 ENCOUNTER — Other Ambulatory Visit: Payer: Self-pay | Admitting: Internal Medicine

## 2010-09-08 ENCOUNTER — Telehealth: Payer: Self-pay | Admitting: Internal Medicine

## 2010-09-08 MED ORDER — CARVEDILOL 6.25 MG PO TABS: 6.2500 mg | ORAL_TABLET | Freq: Two times a day (BID) | ORAL | Status: DC

## 2010-09-08 NOTE — Telephone Encounter (Signed)
Pt states [facility] has sent a few requests this week for Carvedilol, she called [facility] today and they told her to call here to see why no refill has been issued.

## 2010-09-08 NOTE — Telephone Encounter (Signed)
Called pt back to verify med we have not received request for generic coreg. Did send back norvasc & HCTZ. Will send refill on Carvedilol 6.25.Marland KitchenMarland Kitchen5/11/12@4 :17pm/LMB

## 2010-09-11 ENCOUNTER — Other Ambulatory Visit: Payer: Self-pay | Admitting: Internal Medicine

## 2010-09-15 NOTE — Op Note (Signed)
[facility]  Patient:    Terry Hull, Terry Hull Visit Number: [id number] MRN: [id number]          Service Type: GYN Location: [id number] Attending Physician:  Michaele Offer Dictated by:   Zenaida Niece, M.D. Proc. Date: 07/23/01 Admit Date:  07/23/2001                             Operative Report  PREOPERATIVE DIAGNOSES:       Symptomatic leiomyomatous uterus.  POSTOPERATIVE DIAGNOSES:      Symptomatic leiomyomatous uterus.  PROCEDURE:                    Laparoscopic assisted vaginal hysterectomy with bilateral salpingo-oophorectomy.  SURGEON:                      Zenaida Niece, M.D.  ASSISTANT:                    Malachi Pro. Ambrose Mantle, M.D.  ANESTHESIA:                   General endotracheal tube.  ESTIMATED BLOOD LOSS:         200 cc.  FINDINGS:                     Enlarged irregular uterus consistent with fibroids with normal tubes and ovaries.  She had a normal appearing liver edge with some adhesions of the liver edge to the anterior abdominal wall.  PROCEDURE IN DETAIL:          Patient was taken to the operating room and placed in a dorsal supine position.  General anesthesia was induced and she was placed in mobile stirrups.  Her abdomen, perineum, and vagina were prepped and draped in the usual sterile fashion, her bladder drained with a Red rubber catheter, and a Hulka tenaculum applied to her cervix for uterine manipulation.  Her infraumbilical skin was then infiltrated with 0.25% Marcaine.  A 1.5 cm horizontal incision was made and the Veress needle was inserted into the peritoneal cavity.  Placement was confirmed by the water drop test and an opening pressure of 5 mmHg.  Approximately 2 L of CO2 gas were insufflated and the Veress needle was removed.  The 10-11 disposable trocar was then introduced and placement confirmed by the laparoscope.  Ports 5 mm were placed bilaterally under direct visualization.  Inspection  revealed the above mentioned findings.  Both infundibulopelvic ligaments were desiccated with bipolar cautery and transected.  This was carried out through the mesosalpinx, the round ligament, and the upper broad ligaments on each side using the tripolar device with adequate hemostasis.  The bladder flap was then created across the anterior portion of the uterus, also with the tripolar device.  All pedicles were hemostatic.  Attention was then turned vaginally.  The patients legs were raised in the stirrups.  A weighted speculum was inserted and a Deaver used to retract the sides and anteriorly.  The cervix was grasped with a Christella Hartigan tenaculum and the cervicovaginal mucosa was incised with electrocautery.  The posterior cul-de-sac was then grasped and entered sharp and a ______ speculum placed in the posterior cul-de-sac.  The vagina was pushed off of the cervix and the uterosacral ligaments were clamped, transected, and ligated with #1 chromic. The bladder was then pushed off further inferiorly but the  peritoneum was not entered yet.  Uterine ligaments and lower broad ligaments were clamped, transected, and ligated with #1 chromic.  The anterior peritoneum was then identified and entered sharply.  A Deaver was used to retract the bladder anteriorly.  The remainder of the broad ligament was clamped, transected, and ligated on each side until the uterus was freed.  The uterus was then easily removed with the ovaries attached.  Bleeding from each side was controlled with suture of #1 chromic as well as electrocautery.  The vaginal cuff was run posteriorly to the peritoneum with #1 chromic to aide in hemostasis.  The uterosacral ligaments were then plicated in the midline with 2-0 silk.  The vagina was closed in a running locking fashion with 2-0 Vicryl with adequate hemostasis and adequate closure.  The patient had been given indigo carmine IV.  Cystoscopy was performed with the 70 degree  cystoscope and confirmed no bladder injury and both ureteral orifices were normal and there was indigo carmine seen flow from each ureteral orifice.  The cystoscope was removed and a Foley catheter was placed.  Dr. Ambrose Mantle and I then changed gloves.  The legs were taken down from their elevated position.  Gas was allowed to reinflate the abdomen and I looked again through the laparoscope.  There was a small amount of bleeding from the under portion of the bladder and this was taken care of with bipolar cautery. The remainder of the pelvis was hemostatic.  The 5 mm ports were removed under direct visualization.  The laparoscope was removed and all gas allowed to deflate from the abdomen and the 10-11 disposable trocar was then removed. Skin incisions were closed with interrupted subcuticular sutures of 4-0 Vicryl followed by Steri-Strips and Band-aids.  Patient was awakened in the operating room, tolerated the procedure well, and was taken to the recovery room in stable condition. Dictated by:   Zenaida Niece, M.D. Attending Physician:  Michaele Offer DD:  07/23/01 TD:  07/24/01 Job: [id number]

## 2010-09-15 NOTE — H&P (Signed)
[facility]  Patient:    Terry Hull, Terry Hull Visit Number: [id number] MRN: [id number]          Service Type: Attending:  Zenaida Niece, M.D. Dictated by:   Zenaida Niece, M.D. Adm. Date:  07/23/01                           History and Physical  CHIEF COMPLAINT:              Symptomatic leiomyomatous uterus.  HISTORY OF PRESENT ILLNESS:   This is a 55 year old black female gravida 1 para 1-0-1-1 whom I saw for an annual exam on July 04, 2001.  She says she has regular menses which can be very painful and she has constant back pressure, especially with activity.  This discomfort is starting to affect her level of activity.  On pelvic exam she had an enlarged uterus consistent with fibroids. Pelvic ultrasound reveals a normal-sized uterus with two intrauterine fibroids.  These measure 2.2 x 2.2 x 2.2 cm, and the second fibroid is partially submucosal, measuring 2.9 x 2.5 x 2.6 cm and is slightly increased from prior exam.  Ovaries appear normal.  Due to her symptoms, including painful menses, the patient was offered hysteroscopy to resect the fibroid or hysterectomy to remove the entire uterus, and patient wishes to proceed with hysterectomy, also secondary to the fact that she has a family history of ovarian cancer.  PAST OBSTETRICAL HISTORY:     One vaginal delivery at term without complications.  PAST MEDICAL HISTORY:         Hypertension which has been well controlled.  SURGICAL HISTORY:             Nose surgery and tubal ligation.  MEDICATIONS:                  Norvasc and hydrochlorothiazide.  ALLERGIES:                    None known.  SOCIAL HISTORY:               Patient is married and does smoke one-half to a pack of cigarettes a day.  Denies significant alcohol use.  REVIEW OF SYSTEMS:            GU and GI are negative and she has no significant chest pain or shortness of breath.  FAMILY HISTORY:               Mother had ovarian  cancer.  PHYSICAL EXAMINATION:  VITAL SIGNS:                  Weight 165 pounds.  Blood pressure 120/72, pulse 76.  GENERAL:                      She is a well-developed, well-nourished black female in no acute distress.  HEENT:                        Pupils equally round and reactive to light and accommodation.  Extraocular muscles are intact.  Oropharynx is clear without erythema or exudates.  NECK:                         Supple without lymphadenopathy or thyromegaly.  LUNGS:  Clear to auscultation.  HEART:                        Regular rate and rhythm with a possible 1-2/6 systolic murmur.  ABDOMEN:                      Soft, nontender, nondistended, without palpable masses, and she does have an infraumbilical scar.  EXTREMITIES:                  Have no edema, are nontender, and DTRs are 2/4 and symmetric.  PELVIC:                       External genitalia has no lesions.  Cervix is normal and Pap smear has come back also normal.  On bimanual exam she has an anteverted uterus which was approximately eight weeks in size, slightly irregular, and it is quite mobile.  There is no significant prolapse and no cystocele or rectocele.  Rectovaginal exam confirms this.  ASSESSMENT:                   Symptomatic leiomyomatous uterus and family history of ovarian cancer.  Due to the fibroids and painful menses, the patient wishes to proceed with definitive therapy with hysterectomy.  She also wishes to have her ovaries removed due to her family history.  Risks of surgery have been discussed with the patient including bleeding, infection, and damage to surrounding organs. Patient agrees to proceed.  PLAN:                         Admit the patient on the day of surgery for a laparoscopically-assisted vaginal hysterectomy with bilateral salpingo-oophorectomy and cystoscopy.  Patient does understand that the uterus maybe too large to be removed vaginally, at  which time we would proceed with a laparotomy.Dictated by:   Zenaida Niece, M.D. Attending:  Zenaida Niece, M.D. DD:  07/22/01 TD:  07/22/01 Job: [id number]

## 2010-09-15 NOTE — Discharge Summary (Signed)
[facility]  Patient:    Terry Hull, Terry Hull Visit Number: [id number] MRN: [id number]          Service Type: GYN Location: [phone] Attending Physician:  Michaele Offer Dictated by:   Zenaida Niece, M.D. Admit Date:  07/23/2001 Discharge Date: 07/24/2001                             Discharge Summary  ADMISSION DIAGNOSES:          1. Symptomatic leiomyomatous uterus.                               2. Family history of ovarian cancer.  DISCHARGE DIAGNOSES:          1. Symptomatic leiomyomatous uterus.                               2. Family history of ovarian cancer.  PROCEDURES:                   1. Laparoscopic-assisted vaginal hysterectomy.                               2. Bilateral salpingo-oophorectomy.                               3. Cystoscopy.  COMPLICATIONS:                None.  CONSULTATIONS:                None.  HISTORY:                      Please see History & Physical on the chart for full History & Physical, but briefly, this is a 56 year old black female with symptomatic leiomyomatous uterus and a probable submucosal leiomyoma as well as intramural leiomyomas by ultrasound who was admitted for definitive therapy.  The patient also has a family history of ovarian cancer where her mother had ovarian cancer, so she wishes ovarian removal also.  PHYSICAL EXAMINATION:         Significant for a benign abdomen without palpable masses, and she does have an infraumbilical scar.  On pelvic exam, she has an 8 to 10-week size uterus which is irregular and enlarged but is fairly mobile, and she has no pelvic masses.  HOSPITAL COURSE:              The patient was admitted on the day of surgery and underwent an LVAH-BSO under general anesthesia without complication. Estimated blood loss was 200 cc.  She did have a large, irregular uterus consistent with fibroids.  Postoperatively, she did very well, was able to ambulate and  tolerate her diet and remained afebrile.  Preoperative hemoglobin 13.3, postoperatively it was 11.0.  On the afternoon of postoperative day #1, she was felt to be stable enough for discharge home.  DIET:                         Regular.  ACTIVITY:                     Pelvic  rest, no strenuous activity, no driving.  FOLLOWUP:                     In two weeks.  MEDICATIONS:                  1. Percocet p.r.n. pain.                               2. Estrace 1 mg p.o. q.d. Dictated by:   Zenaida Niece, M.D. Attending Physician:  Michaele Offer DD:  07/24/01 TD:  07/25/01 Job: [phone]

## 2010-11-06 ENCOUNTER — Encounter: Payer: Self-pay | Admitting: Internal Medicine

## 2010-11-07 ENCOUNTER — Ambulatory Visit (INDEPENDENT_AMBULATORY_CARE_PROVIDER_SITE_OTHER): Payer: PRIVATE HEALTH INSURANCE | Admitting: Internal Medicine

## 2010-11-07 ENCOUNTER — Encounter: Payer: Self-pay | Admitting: Internal Medicine

## 2010-11-07 VITALS — BP 152/90 | HR 70 | Temp 98.8°F | Ht 65.0 in | Wt 162.5 lb

## 2010-11-07 DIAGNOSIS — J309 Allergic rhinitis, unspecified: Secondary | ICD-10-CM

## 2010-11-07 DIAGNOSIS — I10 Essential (primary) hypertension: Secondary | ICD-10-CM

## 2010-11-07 DIAGNOSIS — F411 Generalized anxiety disorder: Secondary | ICD-10-CM

## 2010-11-07 DIAGNOSIS — J209 Acute bronchitis, unspecified: Secondary | ICD-10-CM

## 2010-11-07 MED ORDER — LEVOFLOXACIN 500 MG PO TABS: 500.0000 mg | ORAL_TABLET | Freq: Every day | ORAL | Status: AC

## 2010-11-07 MED ORDER — HYDROCODONE-HOMATROPINE 5-1.5 MG/5ML PO SYRP: 5.0000 mL | ORAL_SOLUTION | Freq: Four times a day (QID) | ORAL | Status: AC | PRN

## 2010-11-07 MED ORDER — CEFTRIAXONE SODIUM 1 G IJ SOLR
1.0000 g | Freq: Once | INTRAMUSCULAR | Status: AC
  Administered 2010-11-07: 1 g via INTRAMUSCULAR

## 2010-11-07 NOTE — Assessment & Plan Note (Signed)
stable overall by hx and exam, most recent data reviewed with pt, and pt to continue medical treatment as before  BP Readings from Last 3 Encounters:  11/07/10 152/90  05/29/10 182/90  02/28/10 138/76

## 2010-11-07 NOTE — Patient Instructions (Signed)
You had the antibiotic shot today Take all new medications as prescribed Continue all other medications as before

## 2010-11-07 NOTE — Assessment & Plan Note (Signed)
stable overall by hx and exam,  and pt to continue medical treatment as , mentioned allegra OTC as prn tx

## 2010-11-07 NOTE — Progress Notes (Signed)
Subjective:    Patient ID: Terry Hull, female    DOB: [birth year], 55 y.o.   MRN: [id number]  HPI Here with acute onset mild to mod 2-3 days ST, HA, general weakness and malaise, with prod cough greenish sputum, but Pt denies chest pain, increased sob or doe, wheezing, orthopnea, PND, increased LE swelling, palpitations, dizziness or syncope.  Does have several wks ongoing nasal allergy symptoms with clear congestion, itch and sneeze, without fever, pain, ST, cough or wheezing.  Denies worsening depressive symptoms, suicidal ideation, or panic, though has ongoing anxiety, not increased recently. Pt denies new neurological symptoms such as new headache, or facial or extremity weakness or numbness  Pt denies polydipsia, polyuria,   Pt states overall good compliance with meds, trying to follow lower cholesterol, diabetic diet, wt overall stable but little exercise however.    Past Medical History  Diagnosis Date  . DIABETES MELLITUS, TYPE II 02/23/2010  . HYPERTRIGLYCERIDEMIA 02/28/2010  . HYPERTENSION 02/28/2010  . ALLERGIC RHINITIS 02/28/2010  . ARTHRITIS 02/28/2010  . SLEEP APNEA 02/08/2010  . CHEST PAIN 02/04/2010  . Abnormal electrocardiogram     consistent with LVH hypertrophy   Past Surgical History  Procedure Date  . Nasal polyp surgery   . Abdominal hysterectomy   . Hand tendon surgery     reports that she has been smoking.  She does not have any smokeless tobacco history on file. She reports that she drinks alcohol. She reports that she does not use illicit drugs. family history includes Cancer in her mother; Diabetes in her other; Hyperlipidemia in her other; and Hypertension in her other. No Known Allergies Current Outpatient Prescriptions on File Prior to Visit  Medication Sig Dispense Refill  . ALPRAZolam (XANAX) 0.5 MG tablet Take 1 every 8 hours as needed for anxiety       . amLODipine (NORVASC) 5 MG tablet TAKE 1 TABLET BY MOUTH EVERY DAY  90 tablet  0  . aspirin 81 MG  tablet Take 81 mg by mouth daily.        . carvedilol (COREG) 6.25 MG tablet Take 1 tablet (6.25 mg total) by mouth 2 (two) times daily with a meal.  60 tablet  5  . citalopram (CELEXA) 20 MG tablet TAKE  1/2 TABLET BY MOUTH DAILY FOR 2 WEEKS, THEN INCREASE TO 1 TABLET BY MOUTH DAILY  30 tablet  3  . hydrochlorothiazide (,MICROZIDE/HYDRODIURIL,) 12.5 MG capsule TAKE 1 CAPSULE BY MOUTH EVERY DAY  90 capsule  0  . lisinopril (PRINIVIL,ZESTRIL) 40 MG tablet TAKE 1 TABLET BY MOUTH EVERY DAY  90 tablet  0  . pravastatin (PRAVACHOL) 20 MG tablet TAKE 1 TABLET BY MOUTH EVERY DAY  90 tablet  0   No current facility-administered medications on file prior to visit.   Review of Systems Review of Systems  Constitutional: Negative for diaphoresis and unexpected weight change.  HENT: Negative for drooling and tinnitus.   Eyes: Negative for photophobia and visual disturbance.  Respiratory: Negative for choking and stridor.   Gastrointestinal: Negative for vomiting and blood in stool.  Genitourinary: Negative for hematuria and decreased urine volume.      Objective:   Physical Exam BP 152/90  Pulse 70  Temp(Src) 98.8 F (37.1 C) (Oral)  Ht 5\' 5"  (1.651 m)  Wt 162 lb 8 oz (73.71 kg)  BMI 27.04 kg/m2  SpO2 95% Physical Exam  VS noted, mild ill Constitutional: Pt appears well-developed and well-nourished.  HENT: Head: Normocephalic.  Right  Ear: External ear normal.  Left Ear: External ear normal.  Bilat tm's mild erythema.  Sinus nontender.  Pharynx mild erythema Eyes: Conjunctivae and EOM are normal. Pupils are equal, round, and reactive to light.  Neck: Normal range of motion. Neck supple.  Cardiovascular: Normal rate and regular rhythm.   Pulmonary/Chest: Effort normal and breath sounds normal.  Neurological: Pt is alert. No cranial nerve deficit.  Skin: Skin is warm. No erythema.  Psychiatric: Pt behavior is normal. Thought content normal. 1+ nervous, not depressed affect         Assessment & Plan:

## 2010-11-07 NOTE — Assessment & Plan Note (Addendum)
Mod to severe it seems - cant r/o pna , for rocephin IM today, levaquin and cough med for home, declines CXR,  to f/u any worsening symptoms or concerns

## 2010-11-07 NOTE — Assessment & Plan Note (Signed)
stable overall by hx and exam, and pt to continue medical treatment as before 

## 2010-11-23 ENCOUNTER — Other Ambulatory Visit: Payer: Self-pay | Admitting: Internal Medicine

## 2010-11-29 ENCOUNTER — Other Ambulatory Visit: Payer: Self-pay | Admitting: Internal Medicine

## 2011-01-10 ENCOUNTER — Other Ambulatory Visit: Payer: Self-pay | Admitting: Internal Medicine

## 2011-01-10 NOTE — Telephone Encounter (Signed)
Faxed script back to [facility] @ [phone].Marland KitchenMarland Kitchen9/12/12@2 :05pm/LMB

## 2011-01-22 ENCOUNTER — Other Ambulatory Visit: Payer: Self-pay | Admitting: Internal Medicine

## 2011-02-20 ENCOUNTER — Other Ambulatory Visit: Payer: Self-pay | Admitting: Internal Medicine

## 2011-02-26 ENCOUNTER — Other Ambulatory Visit: Payer: Self-pay | Admitting: Internal Medicine

## 2011-03-20 ENCOUNTER — Other Ambulatory Visit: Payer: Self-pay | Admitting: Internal Medicine

## 2011-04-18 ENCOUNTER — Other Ambulatory Visit: Payer: Self-pay | Admitting: Internal Medicine

## 2011-04-18 NOTE — Telephone Encounter (Signed)
Faxed script back to [facility] @ [phone]...04/18/11@1 :47pm/LMB

## 2011-04-19 ENCOUNTER — Other Ambulatory Visit: Payer: Self-pay | Admitting: Internal Medicine

## 2011-05-21 ENCOUNTER — Other Ambulatory Visit: Payer: Self-pay | Admitting: Internal Medicine

## 2011-05-29 ENCOUNTER — Other Ambulatory Visit: Payer: Self-pay | Admitting: Internal Medicine

## 2011-06-19 ENCOUNTER — Other Ambulatory Visit: Payer: Self-pay | Admitting: Internal Medicine

## 2011-07-13 ENCOUNTER — Other Ambulatory Visit (INDEPENDENT_AMBULATORY_CARE_PROVIDER_SITE_OTHER): Payer: PRIVATE HEALTH INSURANCE

## 2011-07-13 ENCOUNTER — Ambulatory Visit (INDEPENDENT_AMBULATORY_CARE_PROVIDER_SITE_OTHER): Payer: PRIVATE HEALTH INSURANCE | Admitting: Internal Medicine

## 2011-07-13 ENCOUNTER — Other Ambulatory Visit: Payer: Self-pay | Admitting: Internal Medicine

## 2011-07-13 ENCOUNTER — Encounter: Payer: Self-pay | Admitting: Internal Medicine

## 2011-07-13 VITALS — BP 144/80 | HR 69 | Temp 97.3°F | Ht 65.5 in | Wt 156.0 lb

## 2011-07-13 DIAGNOSIS — E119 Type 2 diabetes mellitus without complications: Secondary | ICD-10-CM

## 2011-07-13 DIAGNOSIS — E781 Pure hyperglyceridemia: Secondary | ICD-10-CM

## 2011-07-13 DIAGNOSIS — Z0000 Encounter for general adult medical examination without abnormal findings: Secondary | ICD-10-CM

## 2011-07-13 DIAGNOSIS — F411 Generalized anxiety disorder: Secondary | ICD-10-CM

## 2011-07-13 DIAGNOSIS — I10 Essential (primary) hypertension: Secondary | ICD-10-CM

## 2011-07-13 LAB — HEPATIC FUNCTION PANEL
ALT: 31 U/L (ref 0–35)
AST: 20 U/L (ref 0–37)
Albumin: 4.1 g/dL (ref 3.5–5.2)
Alkaline Phosphatase: 81 U/L (ref 39–117)
Bilirubin, Direct: 0 mg/dL (ref 0.0–0.3)
Total Bilirubin: 0.2 mg/dL — ABNORMAL LOW (ref 0.3–1.2)
Total Protein: 7.5 g/dL (ref 6.0–8.3)

## 2011-07-13 LAB — BASIC METABOLIC PANEL WITH GFR
BUN: 14 mg/dL (ref 6–23)
CO2: 28 meq/L (ref 19–32)
Calcium: 9.9 mg/dL (ref 8.4–10.5)
Chloride: 99 meq/L (ref 96–112)
Creatinine, Ser: 0.8 mg/dL (ref 0.4–1.2)
GFR: 92.59 mL/min
Glucose, Bld: 453 mg/dL — ABNORMAL HIGH (ref 70–99)
Potassium: 4.1 meq/L (ref 3.5–5.1)
Sodium: 135 meq/L (ref 135–145)

## 2011-07-13 LAB — CBC WITH DIFFERENTIAL/PLATELET
Basophils Absolute: 0 K/uL (ref 0.0–0.1)
Basophils Relative: 0.2 % (ref 0.0–3.0)
Eosinophils Absolute: 0.2 K/uL (ref 0.0–0.7)
Eosinophils Relative: 1.7 % (ref 0.0–5.0)
HCT: 42.3 % (ref 36.0–46.0)
Hemoglobin: 14.5 g/dL (ref 12.0–15.0)
Lymphocytes Relative: 25.1 % (ref 12.0–46.0)
Lymphs Abs: 2.8 K/uL (ref 0.7–4.0)
MCHC: 34.2 g/dL (ref 30.0–36.0)
MCV: 94 fl (ref 78.0–100.0)
Monocytes Absolute: 0.7 K/uL (ref 0.1–1.0)
Monocytes Relative: 6.5 % (ref 3.0–12.0)
Neutro Abs: 7.4 K/uL (ref 1.4–7.7)
Neutrophils Relative %: 66.5 % (ref 43.0–77.0)
Platelets: 282 K/uL (ref 150.0–400.0)
RBC: 4.51 Mil/uL (ref 3.87–5.11)
RDW: 13.7 % (ref 11.5–14.6)
WBC: 11.1 K/uL — ABNORMAL HIGH (ref 4.5–10.5)

## 2011-07-13 LAB — LIPID PANEL
HDL: 48.2 mg/dL (ref >39.00)
Total CHOL/HDL Ratio: 5
VLDL: 118.4 mg/dL — ABNORMAL HIGH (ref 0.0–40.0)

## 2011-07-13 LAB — HEMOGLOBIN A1C: Hgb A1c MFr Bld: 11.3 % — ABNORMAL HIGH (ref 4.6–6.5)

## 2011-07-13 LAB — URINALYSIS
Bilirubin Urine: NEGATIVE
Ketones, ur: NEGATIVE
Leukocytes, UA: NEGATIVE
Nitrite: NEGATIVE
Specific Gravity, Urine: 1.015
Total Protein, Urine: NEGATIVE
Urine Glucose: >=1000
Urobilinogen, UA: 0.2
pH: 6 (ref 5.0–8.0)

## 2011-07-13 LAB — LDL CHOLESTEROL, DIRECT: Direct LDL: 96.3 mg/dL

## 2011-07-13 MED ORDER — CITALOPRAM HYDROBROMIDE 20 MG PO TABS: 20.0000 mg | ORAL_TABLET | Freq: Every day | ORAL | Status: DC

## 2011-07-13 MED ORDER — FLUTICASONE PROPIONATE 50 MCG/ACT NA SUSP: 2.0000 | Freq: Every day | NASAL | Status: DC

## 2011-07-13 MED ORDER — AMLODIPINE BESYLATE 5 MG PO TABS: 5.0000 mg | ORAL_TABLET | Freq: Every day | ORAL | Status: DC

## 2011-07-13 MED ORDER — HYDROCHLOROTHIAZIDE 12.5 MG PO CAPS: 12.5000 mg | ORAL_CAPSULE | ORAL | Status: DC

## 2011-07-13 MED ORDER — SITAGLIPTIN PHOSPHATE 100 MG PO TABS: 100.0000 mg | ORAL_TABLET | Freq: Every day | ORAL | Status: DC

## 2011-07-13 NOTE — Assessment & Plan Note (Signed)
On statin - Check labs as with CPX above

## 2011-07-13 NOTE — Progress Notes (Signed)
Subjective:    Patient ID: Terry Hull, female    DOB: 06/14/1955, 56 y.o.   MRN: [id number]  HPI  patient is here today for annual physical. Patient feels well and has no complaints.  Also reviewed chronic medical issues:  hosp 01/2010 for CP - s/p cardiac eval - noncardiac etiology - pt attributes to stress and anxiety   HTN - long hx same, variable control on current meds- reports compliance with ongoing medical treatment and no changes in medication dose or frequency. denies adverse side effects related to current therapy.     dyslipidemia, esp high TGs - rx'd trilipix 01/2010 hosp but never started, cont statin as prev rx'd - reports compliance with ongoing medical treatment and no changes in medication dose or frequency. denies adverse side effects related to current therapy.     DM2, diet controlled at this time - remotely on "sugar pill" but caused her to be ill - dx reconfirmed 01/2010 hosp - following low sugar/ low carb diet - no regular exercise or nutritionist meeting   depression and anxiety - onset 2008 - precipitated by financial problems - started SSRI celexa 1/12 and uses xanax as needed - never on other meds or counseling - not worse  Past Medical History  Diagnosis Date  . DIABETES MELLITUS, TYPE II     diet controlled  . HYPERTRIGLYCERIDEMIA   . HYPERTENSION   . ALLERGIC RHINITIS   . ARTHRITIS   . SLEEP APNEA   . Abnormal electrocardiogram     consistent with LVH hypertrophy   Family History  Problem Relation Age of Onset  . Cancer Mother     Ovarian Cancer  . Hyperlipidemia Mother   . Hypertension Mother   . Diabetes Other     1st degree relative   History  Substance Use Topics  . Smoking status: Current Everyday Smoker -- 1.0 packs/day  . Smokeless tobacco: Not on file  . Alcohol Use: Yes    Review of Systems Constitutional: Negative for fever or weight change.  Respiratory: Negative for cough and shortness of breath.   Cardiovascular:  Negative for chest pain or palpitations.  Gastrointestinal: Negative for abdominal pain, no bowel changes.  Musculoskeletal: Negative for gait problem or joint swelling.  Skin: Negative for rash.  Neurological: Negative for dizziness or headache.  No other specific complaints in a complete review of systems (except as listed in HPI above).     Objective:   Physical Exam BP 144/80  Pulse 69  Temp(Src) 97.3 F (36.3 C) (Oral)  Ht 5' 5.5" (1.664 m)  Wt 156 lb (70.761 kg)  BMI 25.56 kg/m2  SpO2 95% Wt Readings from Last 3 Encounters:  07/13/11 156 lb (70.761 kg)  11/07/10 162 lb 8 oz (73.71 kg)  05/29/10 165 lb 6.4 oz (75.025 kg)   Constitutional: She appears well-developed and well-nourished. No distress.  HENT: Head: Normocephalic and atraumatic. Ears: B TMs ok, no erythema or effusion; Nose: Nose normal.  Mouth/Throat: Oropharynx is clear and moist. No oropharyngeal exudate.  Eyes: Conjunctivae and EOM are normal. Pupils are equal, round, and reactive to light. No scleral icterus.  Neck: Normal range of motion. Neck supple. No JVD present. No thyromegaly present.  Cardiovascular: Normal rate, regular rhythm and normal heart sounds.  No murmur heard. No BLE edema. Pulmonary/Chest: Effort normal and breath sounds normal. No respiratory distress. She has no wheezes.  Abdominal: Soft. Bowel sounds are normal. She exhibits no distension. There is no tenderness. no  masses Musculoskeletal: Normal range of motion, no joint effusions. No gross deformities Neurological: She is alert and oriented to person, place, and time. No cranial nerve deficit. Coordination normal.  Skin: Skin is warm and dry. No rash noted. No erythema.  Psychiatric: She has a normal mood and affect. Her behavior is normal. Judgment and thought content normal.   Lab Results  Component Value Date   WBC 10.7* 02/06/2010   HGB 12.8 02/06/2010   HCT 37.9 02/06/2010   PLT 285 02/06/2010   GLUCOSE 145* 02/06/2010    CHOL  Value: 168 (NOTE) ATP III Classification:      < 200        mg/dL        Desirable     161 - 239     mg/dL        Borderline High     >= 240        mg/dL        High  01/03/453   TRIG 392* 02/05/2010   HDL 36* 02/05/2010   LDLCALC  Value: 54 (NOTE)  Total Cholesterol/HDL Ratio:CHD Risk                       Coronary Heart Disease Risk Table                                       Men       Women         1/2 Average Risk              3.4        3.3             Average Risk              5.0         4.4         2 X Average Risk              9.6        7.1         3 X Average Risk             23.4       11.0 Use the calculated Patient Ratio above and the CHD Risk table  to determine the patient's CHD Risk. ATP III Classification (LDL):      < 100         mg/dL         Optimal     098 - 129     mg/dL         Near or Above Optimal     130 - 159     mg/dL         Borderline High     160 - 189     mg/dL         High      > 119        mg/dL         Very High  14/10/8293   ALT 27 07/15/2008   AST 17 07/15/2008   NA 140 02/06/2010   K 3.7 02/06/2010   CL 108 02/06/2010   CREATININE 0.88 02/06/2010   BUN 15 02/06/2010   CO2 26 02/06/2010   TSH 1.212 02/05/2010   HGBA1C  Value: 6.5 (NOTE)  According to the ADA Clinical Practice Recommendations for 2011, when HbA1c is used as a screening test:   >=6.5%   Diagnostic of Diabetes Mellitus           (if abnormal result  is confirmed)  5.7-6.4%   Increased risk of developing Diabetes Mellitus  References:Diagnosis and Classification of Diabetes Mellitus,Diabetes Care,2011,34(Suppl 1):S62-S69 and Standards of Medical Care in         Diabetes - 2011,Diabetes Care,2011,34  (Suppl 1):S11-S61.* 02/05/2010   EKG: sinus @ 65 bpm - no ST-T changes    Assessment & Plan:  CPX/v70.0 - Patient has been counseled on age-appropriate routine health concerns for screening and prevention. These are reviewed and  up-to-date. Immunizations are up-to-date or declined. Labs and ECG reviewed.  Also See problem list. Medications and labs reviewed today.

## 2011-07-13 NOTE — Assessment & Plan Note (Signed)
Diet controlled - check a1c now  On ACEI and statin Lab Results  Component Value Date   HGBA1C  Value: 6.5 (NOTE)                                                           02/05/2010

## 2011-07-13 NOTE — Patient Instructions (Addendum)
It was good to see you today. Test(s) ordered today. Your results will be called to you after review (48-72hours after test completion). If any changes need to be made, you will be notified at that time. Health Maintenance reviewed - patient asked to schedule her pap smear, patient asked to schedule her mammogram, other recommended screening is up to date. Medications reviewed, start flonase for sinus symptoms - no other changes at this time.  Your prescription(s) have been submitted to your pharmacy. Please take as directed and contact our office if you believe you are having problem(s) with the medication(s). Refill on medication(s) as discussed today. Please schedule followup in 6 months for DM check, call sooner if problems.

## 2011-07-13 NOTE — Assessment & Plan Note (Signed)
BP Readings from Last 3 Encounters:  07/13/11 144/80  11/07/10 152/90  05/29/10 182/90   The current medical regimen is generally effective;  continue present plan and medications.

## 2011-07-13 NOTE — Assessment & Plan Note (Signed)
On SSRI and prn BZ since 04/2010 The current medical regimen is effective;  continue present plan and medications.

## 2011-07-17 ENCOUNTER — Other Ambulatory Visit: Payer: Self-pay | Admitting: Internal Medicine

## 2011-07-17 ENCOUNTER — Telehealth: Payer: Self-pay | Admitting: *Deleted

## 2011-07-17 MED ORDER — ONETOUCH LANCETS MISC: Status: DC

## 2011-07-17 MED ORDER — PIOGLITAZONE HCL 45 MG PO TABS: 45.0000 mg | ORAL_TABLET | Freq: Every day | ORAL | Status: DC

## 2011-07-17 MED ORDER — GLUCOSE BLOOD VI STRP: ORAL_STRIP | Status: DC

## 2011-07-17 NOTE — Telephone Encounter (Signed)
Pt called and stated she already had a one touch ultra BS monitor will need strips & lancets. Inform pt will send to her pharmacy... 07/17/11@2 :21pm/LMB

## 2011-07-19 LAB — HM MAMMOGRAPHY

## 2011-07-23 ENCOUNTER — Encounter: Payer: Self-pay | Admitting: Internal Medicine

## 2011-08-07 ENCOUNTER — Other Ambulatory Visit: Payer: Self-pay | Admitting: Internal Medicine

## 2011-08-08 NOTE — Telephone Encounter (Signed)
Faxed script back to [facility]... 08/08/11@9 :48am/LMB

## 2011-08-16 ENCOUNTER — Other Ambulatory Visit: Payer: Self-pay | Admitting: Endocrinology

## 2011-08-16 ENCOUNTER — Other Ambulatory Visit: Payer: Self-pay | Admitting: Internal Medicine

## 2011-08-23 ENCOUNTER — Other Ambulatory Visit: Payer: Self-pay | Admitting: Endocrinology

## 2011-09-15 ENCOUNTER — Ambulatory Visit (INDEPENDENT_AMBULATORY_CARE_PROVIDER_SITE_OTHER): Payer: PRIVATE HEALTH INSURANCE | Admitting: Family Medicine

## 2011-09-15 VITALS — BP 162/75 | HR 69 | Temp 98.4°F | Resp 18 | Ht 65.5 in | Wt 159.4 lb

## 2011-09-15 DIAGNOSIS — M659 Synovitis and tenosynovitis, unspecified: Secondary | ICD-10-CM

## 2011-09-15 DIAGNOSIS — H6690 Otitis media, unspecified, unspecified ear: Secondary | ICD-10-CM

## 2011-09-15 DIAGNOSIS — M65839 Other synovitis and tenosynovitis, unspecified forearm: Secondary | ICD-10-CM

## 2011-09-15 DIAGNOSIS — H6590 Unspecified nonsuppurative otitis media, unspecified ear: Secondary | ICD-10-CM

## 2011-09-15 MED ORDER — AMOXICILLIN 875 MG PO TABS: 875.0000 mg | ORAL_TABLET | Freq: Two times a day (BID) | ORAL | Status: AC

## 2011-09-15 MED ORDER — METHYLPREDNISOLONE ACETATE 80 MG/ML IJ SUSP
80.0000 mg | Freq: Once | INTRAMUSCULAR | Status: AC
  Administered 2011-09-15: 80 mg via INTRA_ARTICULAR

## 2011-09-15 MED ORDER — CEFTRIAXONE SODIUM 1 G IJ SOLR
1.0000 g | INTRAMUSCULAR | Status: DC
  Administered 2011-09-15: 1 g via INTRAMUSCULAR

## 2011-09-15 NOTE — Patient Instructions (Signed)
Otitis Media with Effusion Otitis media with effusion is the presence of fluid in the middle ear. This is a common problem that often follows ear infections. It may be present for weeks or longer after the infection. Unlike an acute ear infection, otits media with effusion refers only to fluid behind the ear drum and not infection. Children with repeated ear and sinus infections and allergy problems are the most likely to get otitis media with effusion. CAUSES  The most frequent cause of the fluid buildup is dysfunction of the eustacian tubes. These are the tubes that drain fluid in the ears to the throat. SYMPTOMS   The main symptom of this condition is hearing loss. As a result, you or your child may:   Listen to the TV at a loud volume.   Not respond to questions.   Ask "what" often when spoken to.   There may be a sensation of fullness or pressure but usually not pain.  DIAGNOSIS   Your caregiver will diagnose this condition by examining you or your child's ears.   Your caregiver may test the pressure in you or your child's ear with a tympanometer.   A hearing test may be conducted if the problem persists.   A caregiver will want to re-evaluate the condition periodically to see if it improves.  TREATMENT   Treatment depends on the duration and the effects of the effusion.   Antibiotics, decongestants, nose drops, and cortisone-type drugs may not be helpful.   Children with persistent ear effusions may have delayed language. Children at risk for developmental delays in hearing, learning, and speech may require referral to a specialist earlier than children not at risk.   You or your child's caregiver may suggest a referral to an Ear, Nose, and Throat (ENT) surgeon for treatment. The following may help restore normal hearing:   Drainage of fluid.   Placement of ear tubes (tympanostomy tubes).   Removal of adenoids (adenoidectomy).  HOME CARE INSTRUCTIONS   Avoid second hand  smoke.   Infants who are breast fed are less likely to have this condition.   Avoid feeding infants while laying flat.   Avoid known environmental allergens.   Be sure to see a caregiver or an ENT specialist for follow up.   Avoid people who are sick.  SEEK MEDICAL CARE IF:   Hearing is not better in 3 months.   Hearing is worse.   Ear pain.   Drainage from the ear.   Dizziness.  Document Released: 05/24/2004 Document Revised: 04/05/2011 Document Reviewed: 09/06/2009 [facility] Patient Information 2012 [place], [place].De Quervain's Disease Suzette Battiest disease is a condition often seen in racquet sports where there is a soreness (inflammation) in the cord like structures (tendons) which attach muscle to bone on the thumb side of the wrist. There may be a tightening of the tissuesaround the tendons. This condition is often helped by giving up or modifying the activity which caused it. When conservative treatment does not help, surgery may be required. Conservative treatment could include changes in the activity which brought about the problem or made it worse. Anti-inflammatory medications and injections may be used to help decrease the inflammation and help with pain control. Your caregiver will help you determine which is best for you. DIAGNOSIS  Often the diagnosis (learning what is wrong) can be made by examination. Sometimes x-rays are required. HOME CARE INSTRUCTIONS   Apply ice to the sore area for 15 to 20 minutes, 3 to 4  times per day while awake. Put the ice in a plastic bag and place a towel between the bag of ice and your skin. This is especially helpful if it can be done after all activities involving the sore wrist.   Temporary splinting may help.   Only take over-the-counter or prescription medicines for pain, discomfort or fever as directed by your caregiver.  SEEK MEDICAL CARE IF:   Pain relief is not obtained with medications, or if you have increasing pain and  seem to be getting worse rather than better.  MAKE SURE YOU:   Understand these instructions.   Will watch your condition.   Will get help right away if you are not doing well or get worse.  Document Released: 01/09/2001 Document Revised: 04/05/2011 Document Reviewed: 04/16/2005 [facility] Patient Information 2012 [place], [place].

## 2011-09-15 NOTE — Progress Notes (Signed)
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 She has 2 problems today.  The first problem is that her left wrist has been extremely sore for several weeks. The pain radiates up and down the radial aspect of her forearm that is most sensitive at the base of the thumb. Pain is produced by either palpation or by laterally moving the wrist.  Patient also has chronic sinus congestion with right otalgia. She has no cough, nausea vomiting. Ear pain has gotten worse and she's having some hearing loss.  Objective: No acute distress  HEENT: Negative except for dull right eardrum.  Neck: Supple no adenopathy  Respirations normal  Examination of the left wrist reveals tenderness over the radial lateral wrist, negative Tinel's, positive Finkelstein's test  Assessment: Otitis media, right  Left Dequervain synovitis  Plan:  Rocephin 1 gm IM, followed by amoxicillin 875 bid

## 2011-11-27 ENCOUNTER — Other Ambulatory Visit: Payer: Self-pay

## 2011-11-28 ENCOUNTER — Other Ambulatory Visit: Payer: Self-pay | Admitting: *Deleted

## 2011-11-28 MED ORDER — ALPRAZOLAM 0.5 MG PO TABS: 0.5000 mg | ORAL_TABLET | Freq: Three times a day (TID) | ORAL | Status: DC | PRN

## 2011-11-28 NOTE — Telephone Encounter (Signed)
Faxed script back to [facility]..11/28/11@4 :00pm/LMB

## 2012-01-10 ENCOUNTER — Ambulatory Visit: Payer: PRIVATE HEALTH INSURANCE | Admitting: Internal Medicine

## 2012-01-23 ENCOUNTER — Ambulatory Visit: Payer: PRIVATE HEALTH INSURANCE | Admitting: Internal Medicine

## 2012-01-23 DIAGNOSIS — Z0289 Encounter for other administrative examinations: Secondary | ICD-10-CM

## 2012-02-15 ENCOUNTER — Other Ambulatory Visit: Payer: Self-pay | Admitting: Internal Medicine

## 2012-02-18 ENCOUNTER — Other Ambulatory Visit: Payer: Self-pay | Admitting: Internal Medicine

## 2012-02-29 ENCOUNTER — Other Ambulatory Visit: Payer: Self-pay | Admitting: Internal Medicine

## 2012-02-29 NOTE — Telephone Encounter (Signed)
Faxed script back to [facility].../lmb 

## 2012-03-26 ENCOUNTER — Other Ambulatory Visit: Payer: Self-pay | Admitting: *Deleted

## 2012-03-26 MED ORDER — PIOGLITAZONE HCL 45 MG PO TABS: 45.0000 mg | ORAL_TABLET | Freq: Every day | ORAL | Status: DC

## 2012-03-26 MED ORDER — LISINOPRIL 40 MG PO TABS: 40.0000 mg | ORAL_TABLET | Freq: Every day | ORAL | Status: DC

## 2012-04-24 ENCOUNTER — Other Ambulatory Visit: Payer: Self-pay | Admitting: *Deleted

## 2012-04-24 MED ORDER — CITALOPRAM HYDROBROMIDE 20 MG PO TABS: 20.0000 mg | ORAL_TABLET | Freq: Every day | ORAL | Status: DC

## 2012-05-15 ENCOUNTER — Other Ambulatory Visit: Payer: Self-pay | Admitting: Internal Medicine

## 2012-05-21 ENCOUNTER — Other Ambulatory Visit: Payer: Self-pay | Admitting: *Deleted

## 2012-05-21 MED ORDER — AMLODIPINE BESYLATE 5 MG PO TABS: 5.0000 mg | ORAL_TABLET | Freq: Every day | ORAL | Status: DC

## 2012-05-21 MED ORDER — PRAVASTATIN SODIUM 20 MG PO TABS: 20.0000 mg | ORAL_TABLET | Freq: Every day | ORAL | Status: DC

## 2012-05-28 ENCOUNTER — Other Ambulatory Visit: Payer: Self-pay | Admitting: *Deleted

## 2012-05-28 MED ORDER — HYDROCHLOROTHIAZIDE 12.5 MG PO CAPS: 12.5000 mg | ORAL_CAPSULE | ORAL | Status: DC

## 2012-06-06 ENCOUNTER — Other Ambulatory Visit: Payer: Self-pay | Admitting: Internal Medicine

## 2012-06-06 NOTE — Telephone Encounter (Signed)
Faxed script bck to [facility].../lmb 

## 2012-06-06 NOTE — Telephone Encounter (Signed)
Lucy, Refilled, ready to be faxed. Rene Kocher

## 2012-06-16 ENCOUNTER — Other Ambulatory Visit: Payer: Self-pay | Admitting: Internal Medicine

## 2012-07-02 ENCOUNTER — Other Ambulatory Visit: Payer: Self-pay | Admitting: *Deleted

## 2012-07-02 MED ORDER — CARVEDILOL 6.25 MG PO TABS: ORAL_TABLET | ORAL | Status: DC

## 2012-07-16 ENCOUNTER — Other Ambulatory Visit: Payer: Self-pay | Admitting: Internal Medicine

## 2012-07-16 NOTE — Telephone Encounter (Signed)
Lucy, I refilled his Actos for 30 days. He will need an appointment with Dr. Felicity Coyer before any more refills given. Last OV 07/13/11

## 2012-07-27 ENCOUNTER — Other Ambulatory Visit: Payer: Self-pay | Admitting: Internal Medicine

## 2012-08-15 ENCOUNTER — Other Ambulatory Visit: Payer: Self-pay | Admitting: Internal Medicine

## 2012-08-17 ENCOUNTER — Other Ambulatory Visit: Payer: Self-pay | Admitting: Internal Medicine

## 2012-08-18 NOTE — Telephone Encounter (Addendum)
Rx faxed to [facility]. 

## 2012-08-20 ENCOUNTER — Other Ambulatory Visit: Payer: Self-pay | Admitting: *Deleted

## 2012-08-20 MED ORDER — CITALOPRAM HYDROBROMIDE 20 MG PO TABS: 20.0000 mg | ORAL_TABLET | Freq: Every day | ORAL | Status: DC

## 2012-08-27 ENCOUNTER — Other Ambulatory Visit: Payer: Self-pay | Admitting: *Deleted

## 2012-08-27 MED ORDER — PIOGLITAZONE HCL 45 MG PO TABS: ORAL_TABLET | ORAL | Status: DC

## 2012-08-27 NOTE — Telephone Encounter (Signed)
R'cd fax from [facility] for refill of Actos.

## 2012-09-15 ENCOUNTER — Other Ambulatory Visit: Payer: Self-pay | Admitting: Internal Medicine

## 2012-09-24 ENCOUNTER — Other Ambulatory Visit: Payer: Self-pay | Admitting: *Deleted

## 2012-10-14 ENCOUNTER — Telehealth: Payer: Self-pay | Admitting: *Deleted

## 2012-10-14 DIAGNOSIS — Z0000 Encounter for general adult medical examination without abnormal findings: Secondary | ICD-10-CM

## 2012-10-14 NOTE — Telephone Encounter (Signed)
Received staff msg entered cpx labs...lmb

## 2012-10-14 NOTE — Telephone Encounter (Signed)
Message copied by Deatra James on Tue Oct 14, 2012  1:11 PM ------      Message from: Etheleen Sia      Created: Tue Oct 14, 2012  8:41 AM      Regarding: LAB       PHYSICAL LABS FOR AUG 15 APPT ------

## 2012-10-15 ENCOUNTER — Other Ambulatory Visit: Payer: Self-pay | Admitting: Internal Medicine

## 2012-10-15 ENCOUNTER — Other Ambulatory Visit: Payer: Self-pay | Admitting: *Deleted

## 2012-10-15 MED ORDER — CARVEDILOL 6.25 MG PO TABS: ORAL_TABLET | ORAL | Status: DC

## 2012-10-15 NOTE — Telephone Encounter (Signed)
R'cd fax from [facility] for refill of Carvedilol.

## 2012-10-22 ENCOUNTER — Other Ambulatory Visit: Payer: Self-pay | Admitting: *Deleted

## 2012-10-22 MED ORDER — PRAVASTATIN SODIUM 20 MG PO TABS: 20.0000 mg | ORAL_TABLET | Freq: Every day | ORAL | Status: DC

## 2012-10-24 ENCOUNTER — Ambulatory Visit (INDEPENDENT_AMBULATORY_CARE_PROVIDER_SITE_OTHER): Payer: PRIVATE HEALTH INSURANCE | Admitting: Internal Medicine

## 2012-10-24 ENCOUNTER — Encounter: Payer: Self-pay | Admitting: Internal Medicine

## 2012-10-24 ENCOUNTER — Other Ambulatory Visit (INDEPENDENT_AMBULATORY_CARE_PROVIDER_SITE_OTHER): Payer: PRIVATE HEALTH INSURANCE

## 2012-10-24 VITALS — BP 142/70 | HR 69 | Temp 98.4°F | Wt 184.4 lb

## 2012-10-24 DIAGNOSIS — M654 Radial styloid tenosynovitis [de Quervain]: Secondary | ICD-10-CM

## 2012-10-24 DIAGNOSIS — M79609 Pain in unspecified limb: Secondary | ICD-10-CM

## 2012-10-24 DIAGNOSIS — M545 Low back pain, unspecified: Secondary | ICD-10-CM

## 2012-10-24 DIAGNOSIS — IMO0001 Reserved for inherently not codable concepts without codable children: Secondary | ICD-10-CM

## 2012-10-24 DIAGNOSIS — I10 Essential (primary) hypertension: Secondary | ICD-10-CM

## 2012-10-24 DIAGNOSIS — R9431 Abnormal electrocardiogram [ECG] [EKG]: Secondary | ICD-10-CM

## 2012-10-24 DIAGNOSIS — M79602 Pain in left arm: Secondary | ICD-10-CM

## 2012-10-24 LAB — LIPID PANEL
Cholesterol: 188 mg/dL (ref 0–200)
Total CHOL/HDL Ratio: 3
Triglycerides: 254 mg/dL — ABNORMAL HIGH (ref 0.0–149.0)

## 2012-10-24 LAB — MICROALBUMIN / CREATININE URINE RATIO
Creatinine,U: 120.6 mg/dL
Microalb Creat Ratio: 1.2 mg/g (ref 0.0–30.0)
Microalb, Ur: 1.5 mg/dL (ref 0.0–1.9)

## 2012-10-24 LAB — HEMOGLOBIN A1C: Hgb A1c MFr Bld: 6.9 % — ABNORMAL HIGH (ref 4.6–6.5)

## 2012-10-24 LAB — BASIC METABOLIC PANEL
BUN: 16 mg/dL (ref 6–23)
Calcium: 9.6 mg/dL (ref 8.4–10.5)
Chloride: 101 mEq/L (ref 96–112)
Creatinine, Ser: 0.8 mg/dL (ref 0.4–1.2)

## 2012-10-24 MED ORDER — DICLOFENAC SODIUM 75 MG PO TBEC: 75.0000 mg | DELAYED_RELEASE_TABLET | Freq: Two times a day (BID) | ORAL | Status: DC

## 2012-10-24 NOTE — Progress Notes (Signed)
Subjective:    Patient ID: Terry Hull, female    DOB: 1956/02/07, 57 y.o.   MRN: [id number]  HPI See CC - last office visit March 2013 in this office (15+mo ago)  continued L wrist pain as eval by UC  08/2012 - now radiating into upper arm - pain worse with movement, especially supination and pronation, lifting - not associated with soft tissue swelling or redness - no precipitating injury. Improved with use of brace but painful when not wearing; not improved with OTC NSAIDs/tylenol  reports dependent edema of ankle and feet. Worse end of day. No change in medications, continues with diuretic tx  Low back pain. Onset February 2014. No precipitating injury. Located bilateral lower back and radiating into buttock bilaterally. Pain worse with movement such as twisting standing. Moderate improvement with rest, supine position and sitting. Pain not improved with over-the-counter indications. No history of same  Also reviewed chronic medical issues:   HTN - long hx same, variable control on current meds- reports compliance with ongoing medical treatment and no changes in medication dose or frequency. denies adverse side effects related to current therapy.     dyslipidemia, esp high TGs - rx'd trilipix 01/2010 hosp but never started, cont statin as prev rx'd - reports compliance with ongoing medical treatment and no changes in medication dose or frequency. denies adverse side effects related to current therapy.     DM2,  Uncontrolled. prescribed Actos March 2013 but lost to followup since - reports compliance with prescribed medication and following low sugar/ low carb diet - no regular exercise or nutritionist meeting   depression and anxiety - onset 2008 - precipitated by financial problems - started SSRI celexa 1/12 and uses xanax as needed - never on other meds or counseling - not worse  Past Medical History  Diagnosis Date  . DIABETES MELLITUS, TYPE II     diet controlled  .  HYPERTRIGLYCERIDEMIA   . HYPERTENSION   . ALLERGIC RHINITIS   . ARTHRITIS   . SLEEP APNEA   . Abnormal electrocardiogram     consistent with LVH hypertrophy   Family History  Problem Relation Age of Onset  . Cancer Mother     Ovarian Cancer  . Hyperlipidemia Mother   . Hypertension Mother   . Diabetes Other     1st degree relative   History  Substance Use Topics  . Smoking status: Current Every Day Smoker -- 1.00 packs/day  . Smokeless tobacco: Not on file  . Alcohol Use: Yes    Review of Systems  Constitutional: Negative for fever or weight change.  Respiratory: Negative for cough and shortness of breath.   Cardiovascular: Negative for chest pain or palpitations.  Gastrointestinal: Negative for abdominal pain, no bowel changes.  Musculoskeletal: Negative for gait problem or joint swelling.  Skin: Negative for rash.  Neurological: Negative for dizziness or headache.  No other specific complaints in a complete review of systems (except as listed in HPI above).     Objective:   Physical Exam  BP 142/70  Pulse 69  Temp(Src) 98.4 F (36.9 C) (Oral)  Wt 184 lb 6.4 oz (83.643 kg)  BMI 30.21 kg/m2  SpO2 95% Wt Readings from Last 3 Encounters:  10/24/12 184 lb 6.4 oz (83.643 kg)  09/15/11 159 lb 6.4 oz (72.303 kg)  07/13/11 156 lb (70.761 kg)   Constitutional: She appears well-developed and well-nourished. No distress.  Neck: Normal range of motion. Neck supple. No JVD present. No  thyromegaly present.  Cardiovascular: Normal rate, regular rhythm and normal heart sounds.  No murmur heard. No BLE edema. Pulmonary/Chest: Effort normal and breath sounds normal. No respiratory distress. She has no wheezes.  Abdominal: Soft. Bowel sounds are normal. She exhibits no distension. There is no tenderness. no masses Musculoskeletal:       Back: full range of motion of thoracic and lumbar spine. Non tender to palpation. Negative straight leg raise. DTR's are symmetrically intact.  Sensation intact in all dermatomes of the lower extremities. Full strength to manual muscle testing. patient is able to heel toe walk without difficulty and ambulates with antalgic gait.       L wrist:Positive Lourena Simmonds. Pain in 1st dorsal compartment. Pain with flexion and extension of thumb. No significant swelling, no redness or signs of infection.       L Shoulder: Full range of motion. Neurovascularly intact distally. Good strength with stress of rotator cuff but causes pain. Positive impingement signs. Neurological: She is alert and oriented to person, place, and time. No cranial nerve deficit. Coordination normal.  Skin: Skin is warm and dry. No rash noted. No erythema.  Psychiatric: She has a normal mood and affect. Her behavior is normal. Judgment and thought content normal.   Lab Results  Component Value Date   WBC 11.1* 07/13/2011   HGB 14.5 07/13/2011   HCT 42.3 07/13/2011   PLT 282.0 07/13/2011   GLUCOSE 453* 07/13/2011   CHOL 237* 07/13/2011   TRIG 592.0 Triglyceride is over 400; calculations on Lipids are invalid.* 07/13/2011   HDL 48.20 07/13/2011   LDLDIRECT 96.3 07/13/2011   LDLCALC  Value: 54 (NOTE)  Total Cholesterol/HDL Ratio:CHD Risk                       Coronary Heart Disease Risk Table                                       Men       Women         1/2 Average Risk              3.4        3.3             Average Risk              5.0         4.4         2 X Average Risk              9.6        7.1         3 X Average Risk             23.4       11.0 Use the calculated Patient Ratio above and the CHD Risk table  to determine the patient's CHD Risk. ATP III Classification (LDL):      < 100         mg/dL         Optimal     409 - 129     mg/dL         Near or Above Optimal     130 - 159     mg/dL         Borderline High     160 - 189  mg/dL         High      > 161        mg/dL         Very High  01/03/453   ALT 31 07/13/2011   AST 20 07/13/2011   NA 135 07/13/2011   K 4.1 07/13/2011    CL 99 07/13/2011   CREATININE 0.8 07/13/2011   BUN 14 07/13/2011   CO2 28 07/13/2011   TSH 0.71 07/13/2011   HGBA1C 11.3* 07/13/2011   EKG: sinus @64  bpm. since March 2013 tracing, flattening inferior Ts. No arrhythmia     Assessment & Plan:   L arm pain/wrist pain -  ongoing >3 months - connsistent with impingement and DeQ tendinitis on exam -  ECG performed today  -because of will potentially ischemic abnormality (inf T flattening), will arrange for cardiac stress test. We'll also prescribe anti-inflammatories and refer to orthopedic  Abnormal EKG - see above  LBP - suspect musculoskeletal. Neuro exam Benign. Anti-inflammatories as for the arm/wrist pain as above and refer to orthopedics for further evaluation and treatment. Back exercises provided  Edema. Trace dep on exam - reassurance provided, continue diuretics. No treatment changes recommended   Also See problem list. Medications and labs reviewed today.

## 2012-10-24 NOTE — Patient Instructions (Signed)
It was good to see you today. We have reviewed your prior records including labs and tests today EKG today performed indicates no evidence for heart problems Medications reviewed and updated - start Voltaren twice daily with food for arm, wrist and back pain - Your prescription(s) have been submitted to your pharmacy. Please take as directed and contact our office if you believe you are having problem(s) with the medication(s). Test(s) ordered today. Your results will be released to MyChart (or called to you) after review, usually within 72hours after test completion. If any changes need to be made, you will be notified at that same time. we'll make referral to orthopedics for evaluation of arm/wrist/back pain . Our office will contact you regarding appointment(s) once made. Please keep scheduled followup in August for further review and evaluation call sooner if problems.

## 2012-10-24 NOTE — Assessment & Plan Note (Signed)
Diet controlled until 2013 - rx Actos due to a1c >10 but no follow up since check a1c, Cr and microalb now - also lipids  On ACEI and statin  Lab Results  Component Value Date   HGBA1C  Value: 6.5 (NOTE)                                                           02/05/2010   Lab Results  Component Value Date   HGBA1C 11.3* 07/13/2011

## 2012-10-24 NOTE — Assessment & Plan Note (Signed)
BP Readings from Last 3 Encounters:  10/24/12 142/70  09/15/11 162/75  07/13/11 144/80   The current medical regimen is generally effective;  continue present plan and medications.

## 2012-11-07 ENCOUNTER — Other Ambulatory Visit: Payer: Self-pay | Admitting: Internal Medicine

## 2012-11-12 ENCOUNTER — Encounter: Payer: PRIVATE HEALTH INSURANCE | Admitting: Nurse Practitioner

## 2012-11-14 ENCOUNTER — Other Ambulatory Visit: Payer: Self-pay | Admitting: Internal Medicine

## 2012-12-03 ENCOUNTER — Encounter: Payer: PRIVATE HEALTH INSURANCE | Admitting: Nurse Practitioner

## 2012-12-12 ENCOUNTER — Encounter: Payer: PRIVATE HEALTH INSURANCE | Admitting: Internal Medicine

## 2012-12-18 ENCOUNTER — Other Ambulatory Visit: Payer: Self-pay | Admitting: Internal Medicine

## 2012-12-19 ENCOUNTER — Other Ambulatory Visit: Payer: Self-pay | Admitting: *Deleted

## 2012-12-19 MED ORDER — DICLOFENAC SODIUM 75 MG PO TBEC: DELAYED_RELEASE_TABLET | ORAL | Status: DC

## 2013-01-05 ENCOUNTER — Emergency Department (HOSPITAL_COMMUNITY)
Admission: EM | Admit: 2013-01-05 | Discharge: 2013-01-05 | Disposition: A | Discharge status: Home / Self Care | Payer: PRIVATE HEALTH INSURANCE | Source: Home / Self Care | Attending: Family Medicine | Admitting: Family Medicine

## 2013-01-05 ENCOUNTER — Emergency Department (INDEPENDENT_AMBULATORY_CARE_PROVIDER_SITE_OTHER): Payer: PRIVATE HEALTH INSURANCE

## 2013-01-05 ENCOUNTER — Encounter (HOSPITAL_COMMUNITY): Payer: Self-pay | Admitting: Emergency Medicine

## 2013-01-05 DIAGNOSIS — J4521 Mild intermittent asthma with (acute) exacerbation: Secondary | ICD-10-CM

## 2013-01-05 DIAGNOSIS — J40 Bronchitis, not specified as acute or chronic: Secondary | ICD-10-CM

## 2013-01-05 DIAGNOSIS — Z720 Tobacco use: Secondary | ICD-10-CM

## 2013-01-05 DIAGNOSIS — J45901 Unspecified asthma with (acute) exacerbation: Secondary | ICD-10-CM

## 2013-01-05 DIAGNOSIS — J410 Simple chronic bronchitis: Secondary | ICD-10-CM

## 2013-01-05 MED ORDER — IPRATROPIUM BROMIDE 0.02 % IN SOLN
0.5000 mg | Freq: Once | RESPIRATORY_TRACT | Status: AC
Start: 2013-01-05 — End: 2013-01-05
  Administered 2013-01-05: 0.5 mg via RESPIRATORY_TRACT

## 2013-01-05 MED ORDER — METHYLPREDNISOLONE SODIUM SUCC 125 MG IJ SOLR
INTRAMUSCULAR | Status: AC
  Filled 2013-01-05: qty 2

## 2013-01-05 MED ORDER — ALBUTEROL SULFATE (5 MG/ML) 0.5% IN NEBU
5.0000 mg | INHALATION_SOLUTION | Freq: Once | RESPIRATORY_TRACT | Status: AC
  Administered 2013-01-05: 5 mg via RESPIRATORY_TRACT

## 2013-01-05 MED ORDER — LEVOFLOXACIN 500 MG PO TABS: 500.0000 mg | ORAL_TABLET | Freq: Every day | ORAL | Status: DC

## 2013-01-05 MED ORDER — ALBUTEROL SULFATE (5 MG/ML) 0.5% IN NEBU
INHALATION_SOLUTION | RESPIRATORY_TRACT | Status: AC
  Filled 2013-01-05: qty 1

## 2013-01-05 MED ORDER — METHYLPREDNISOLONE SODIUM SUCC 125 MG IJ SOLR
125.0000 mg | Freq: Once | INTRAMUSCULAR | Status: AC
  Administered 2013-01-05: 125 mg via INTRAMUSCULAR

## 2013-01-05 MED ORDER — METHYLPREDNISOLONE 4 MG PO KIT: PACK | ORAL | Status: DC

## 2013-01-05 NOTE — ED Provider Notes (Signed)
CSN: [id number]     Arrival date & time 01/05/13  1414 History   First MD Initiated Contact with Patient 01/05/13 1533     Chief Complaint  Patient presents with  . Cough  . Nasal Congestion   (Consider location/radiation/quality/duration/timing/severity/associated sxs/prior Treatment) Patient is a 57 y.o. female presenting with cough. The history is provided by the patient.  Cough Cough characteristics:  Productive, harsh and nocturnal Sputum characteristics:  Yellow Severity:  Moderate Onset quality:  Gradual Duration:  1 week Timing:  Constant Progression:  Unchanged Chronicity:  New Smoker: yes   Associated symptoms: rhinorrhea, shortness of breath, sinus congestion and wheezing   Associated symptoms: no fever     Past Medical History  Diagnosis Date  . DIABETES MELLITUS, TYPE II     diet controlled  . HYPERTRIGLYCERIDEMIA   . HYPERTENSION   . ALLERGIC RHINITIS   . ARTHRITIS   . SLEEP APNEA   . Abnormal electrocardiogram     consistent with LVH hypertrophy   Past Surgical History  Procedure Laterality Date  . Nasal polyp surgery    . Abdominal hysterectomy    . Hand tendon surgery     Family History  Problem Relation Age of Onset  . Cancer Mother     Ovarian Cancer  . Hyperlipidemia Mother   . Hypertension Mother   . Diabetes Other     1st degree relative   History  Substance Use Topics  . Smoking status: Current Every Day Smoker -- 1.00 packs/day  . Smokeless tobacco: Not on file  . Alcohol Use: Yes   OB History   Grav Para Term Preterm Abortions TAB SAB Ect Mult Living                 Review of Systems  Constitutional: Negative.  Negative for fever.  HENT: Positive for rhinorrhea and postnasal drip.   Respiratory: Positive for cough, shortness of breath and wheezing.   Cardiovascular: Negative.   Gastrointestinal: Negative.     Allergies  Review of patient's allergies indicates no known allergies.  Home Medications   Current Outpatient  Rx  Name  Route  Sig  Dispense  Refill  . ALPRAZolam (XANAX) 0.5 MG tablet      TAKE 1 TABLET BY MOUTH THREE TIMES DAILY AS NEEDED FOR SLEEP   90 tablet   0   . amLODipine (NORVASC) 5 MG tablet   Oral   Take 1 tablet (5 mg total) by mouth daily.   90 tablet   0     Physical due in March   . aspirin 81 MG tablet   Oral   Take 81 mg by mouth daily.           . carvedilol (COREG) 6.25 MG tablet      TAKE 1 TABLET BY MOUTH TWICE DAILY WITH A MEAL   60 tablet   1     Overdue for yearly physical must see md b4 additio ...   . citalopram (CELEXA) 20 MG tablet   Oral   Take 1 tablet (20 mg total) by mouth daily.   90 tablet   0     Overdue for yearly physical no additional refills  ...   . diclofenac (VOLTAREN) 75 MG EC tablet      TAKE 1 TABLET BY MOUTH TWICE DAILY   60 tablet   3   . glucose blood (ONE TOUCH TEST STRIPS) test strip      Use check  blood sugar before breakfast and bedtime   60 each   5   . hydrochlorothiazide (MICROZIDE) 12.5 MG capsule   Oral   Take 1 capsule (12.5 mg total) by mouth every morning.   90 capsule   0     Yearly due in March no additional refills until se ...   . levofloxacin (LEVAQUIN) 500 MG tablet   Oral   Take 1 tablet (500 mg total) by mouth daily.   7 tablet   0   . lisinopril (PRINIVIL,ZESTRIL) 40 MG tablet      TAKE 1 TABLET BY MOUTH EVERY DAY   90 tablet   0   . methylPREDNISolone (MEDROL DOSEPAK) 4 MG tablet      follow package directions, start on tues, take until finished   21 tablet   0   . ONE TOUCH LANCETS MISC      Use to help check blood sugar twice a day   60 each   5   . pioglitazone (ACTOS) 45 MG tablet      TAKE 1 TABLET BY MOUTH EVERY DAY   30 tablet   3   . pravastatin (PRAVACHOL) 20 MG tablet   Oral   Take 1 tablet (20 mg total) by mouth daily.   90 tablet   0     Keep August appt for additional refills    BP 174/65  Pulse 70  Temp(Src) 98.3 F (36.8 C) (Oral)  Resp 18   SpO2 95% Physical Exam  Nursing note and vitals reviewed. Constitutional: She appears well-developed and well-nourished.  HENT:  Head: Normocephalic.  Right Ear: External ear normal.  Left Ear: External ear normal.  Mouth/Throat: Oropharynx is clear and moist.  Neck: Normal range of motion. Neck supple.  Cardiovascular: Normal rate and regular rhythm.   Pulmonary/Chest: She has wheezes.  Abdominal: Soft. Bowel sounds are normal.  Lymphadenopathy:    She has no cervical adenopathy.  Skin: Skin is warm and dry.    ED Course  Procedures (including critical care time) Labs Review Labs Reviewed - No data to display Imaging Review Dg Chest 2 View  01/05/2013   *RADIOLOGY REPORT*  Clinical Data: Cough, mild shortness of breath, hypertension, diabetes  CHEST - 2 VIEW  Comparison: 02/04/2010  Findings: Cardiomediastinal silhouette is stable.  No acute infiltrate or pleural effusion.  No pulmonary edema.  Mild perihilar increased bronchial markings suspicious for mild bronchitic changes.  Bony thorax is stable.  IMPRESSION: No acute infiltrate or pulmonary edema.  Mild perihilar increased bronchial markings suspicious for mild bronchitic changes.   Original Report Authenticated By: Natasha Mead, M.D.    MDM   X-rays reviewed and report per radiologist.\ Sx much improved and lungs clear at d/c.       Linna Hoff, MD 01/05/13 2025

## 2013-01-05 NOTE — ED Notes (Signed)
C/o productive white thick mucous cough and chest congestion worst than the nasal congestion for three weeks now. OTC medications taken but no relief.   Patient states she has not slept in four days.   Patient states she does have an headache.

## 2013-01-15 ENCOUNTER — Other Ambulatory Visit: Payer: Self-pay

## 2013-01-15 MED ORDER — CARVEDILOL 6.25 MG PO TABS: ORAL_TABLET | ORAL | Status: DC

## 2013-01-26 ENCOUNTER — Other Ambulatory Visit: Payer: Self-pay | Admitting: Internal Medicine

## 2013-02-16 ENCOUNTER — Other Ambulatory Visit: Payer: Self-pay | Admitting: Internal Medicine

## 2013-02-23 ENCOUNTER — Other Ambulatory Visit: Payer: Self-pay | Admitting: Internal Medicine

## 2013-02-24 ENCOUNTER — Other Ambulatory Visit: Payer: Self-pay | Admitting: Internal Medicine

## 2013-03-05 ENCOUNTER — Other Ambulatory Visit: Payer: Self-pay

## 2013-04-16 ENCOUNTER — Other Ambulatory Visit: Payer: Self-pay | Admitting: Internal Medicine

## 2013-05-14 ENCOUNTER — Other Ambulatory Visit: Payer: Self-pay | Admitting: Internal Medicine

## 2013-05-21 ENCOUNTER — Other Ambulatory Visit: Payer: Self-pay | Admitting: Internal Medicine

## 2013-06-22 ENCOUNTER — Other Ambulatory Visit: Payer: Self-pay | Admitting: Internal Medicine

## 2013-06-29 ENCOUNTER — Other Ambulatory Visit: Payer: Self-pay | Admitting: Internal Medicine

## 2013-07-29 ENCOUNTER — Other Ambulatory Visit: Payer: Self-pay | Admitting: Internal Medicine

## 2013-09-28 ENCOUNTER — Other Ambulatory Visit: Payer: Self-pay | Admitting: Internal Medicine

## 2013-10-07 ENCOUNTER — Other Ambulatory Visit: Payer: Self-pay | Admitting: Internal Medicine

## 2013-10-07 ENCOUNTER — Encounter (HOSPITAL_COMMUNITY): Payer: Self-pay | Admitting: Emergency Medicine

## 2013-10-07 ENCOUNTER — Emergency Department (INDEPENDENT_AMBULATORY_CARE_PROVIDER_SITE_OTHER): Payer: PRIVATE HEALTH INSURANCE

## 2013-10-07 ENCOUNTER — Emergency Department (HOSPITAL_COMMUNITY)
Admission: EM | Admit: 2013-10-07 | Discharge: 2013-10-07 | Disposition: A | Discharge status: Home / Self Care | Payer: PRIVATE HEALTH INSURANCE | Source: Home / Self Care | Attending: Family Medicine | Admitting: Family Medicine

## 2013-10-07 DIAGNOSIS — R042 Hemoptysis: Secondary | ICD-10-CM

## 2013-10-07 MED ORDER — IPRATROPIUM BROMIDE 0.06 % NA SOLN: 2.0000 | Freq: Four times a day (QID) | NASAL | Status: DC

## 2013-10-07 MED ORDER — IPRATROPIUM-ALBUTEROL 0.5-2.5 (3) MG/3ML IN SOLN
RESPIRATORY_TRACT | Status: AC
  Filled 2013-10-07: qty 3

## 2013-10-07 MED ORDER — ALBUTEROL SULFATE HFA 108 (90 BASE) MCG/ACT IN AERS: 2.0000 | INHALATION_SPRAY | Freq: Four times a day (QID) | RESPIRATORY_TRACT | Status: AC | PRN

## 2013-10-07 MED ORDER — PROMETHAZINE-CODEINE 6.25-10 MG/5ML PO SYRP: 5.0000 mL | ORAL_SOLUTION | Freq: Every evening | ORAL | Status: DC | PRN

## 2013-10-07 MED ORDER — IPRATROPIUM-ALBUTEROL 0.5-2.5 (3) MG/3ML IN SOLN
3.0000 mL | Freq: Once | RESPIRATORY_TRACT | Status: AC
  Administered 2013-10-07: 3 mL via RESPIRATORY_TRACT

## 2013-10-07 NOTE — ED Notes (Signed)
Patient receiving breathing treatment prior to CXR 

## 2013-10-07 NOTE — ED Notes (Signed)
Bed: UC09 Expected date:  Expected time:  Means of arrival:  Comments: Nixed at 1415

## 2013-10-07 NOTE — Discharge Instructions (Signed)
Thank you for coming in today. Use albuterol and the cough medicine as needed.  Use the nasal spray.  Follow up with your doctor.  Call or go to the emergency room if you get worse, have trouble breathing, have chest pains, or palpitations.    Hemoptysis Hemoptysis, which means coughing up blood, can be a sign of a minor problem or a serious medical condition. The blood that is coughed up may come from the lungs and airways. Coughed-up blood can also come from bleeding that occurs outside the lungs and airways. Blood can drain into the windpipe during a severe nosebleed or when blood is vomited from the stomach. Because hemoptysis can be a sign of something serious, a medical evaluation is required. For some people with hemoptysis, no definite cause is ever identified. CAUSES  The most common cause of hemoptysis is bronchitis. Some other common causes include:   A ruptured blood vessel caused by coughing or an infection.   A medical condition that causes damage to the large air passageways (bronchiectasis).   A blood clot in the lungs (pulmonary embolism).   Pneumonia.   Tuberculosis.   Breathing in a small foreign object.   Cancer. For some people with hemoptysis, no definite cause is ever identified.  HOME CARE INSTRUCTIONS  Only take over-the-counter or prescription medicines as directed by your caregiver. Do not use cough suppressants unless your caregiver approves.  If your caregiver prescribes antibiotic medicines, take them as directed. Finish them even if you start to feel better.  Do not smoke. Also avoid secondhand smoke.  Follow up with your caregiver as directed. SEEK IMMEDIATE MEDICAL CARE IF:   You cough up bloody mucus for longer than a week.  You have a blood-producing cough that is severe or getting worse.  You have a blood-producing cough thatcomes and goes over time.  You develop problems with your breathing.   You vomit blood.  You develop  bloody or black-colored stools.  You have chest pain.   You develop night sweats.  You feel faint or pass out.   You have a fever or persistent symptoms for more than 2 3 days.  You have a fever and your symptoms suddenly get worse. MAKE SURE YOU:  Understand these instructions.  Will watch your condition.  Will get help right away if you are not doing well or get worse. Document Released: 06/25/2001 Document Revised: 04/02/2012 Document Reviewed: 02/01/2012 [facility] Patient Information 2014 [place], [place].  Cough, Adult  A cough is a reflex that helps clear your throat and airways. It can help heal the body or may be a reaction to an irritated airway. A cough may only last 2 or 3 weeks (acute) or may last more than 8 weeks (chronic).  CAUSES Acute cough:  Viral or bacterial infections. Chronic cough:  Infections.  Allergies.  Asthma.  Post-nasal drip.  Smoking.  Heartburn or acid reflux.  Some medicines.  Chronic lung problems (COPD).  Cancer. SYMPTOMS   Cough.  Fever.  Chest pain.  Increased breathing rate.  High-pitched whistling sound when breathing (wheezing).  Colored mucus that you cough up (sputum). TREATMENT   A bacterial cough may be treated with antibiotic medicine.  A viral cough must run its course and will not respond to antibiotics.  Your caregiver may recommend other treatments if you have a chronic cough. HOME CARE INSTRUCTIONS   Only take over-the-counter or prescription medicines for pain, discomfort, or fever as directed by your caregiver. Use cough  suppressants only as directed by your caregiver.  Use a cold steam vaporizer or humidifier in your bedroom or home to help loosen secretions.  Sleep in a semi-upright position if your cough is worse at night.  Rest as needed.  Stop smoking if you smoke. SEEK IMMEDIATE MEDICAL CARE IF:   You have pus in your sputum.  Your cough starts to worsen.  You cannot control  your cough with suppressants and are losing sleep.  You begin coughing up blood.  You have difficulty breathing.  You develop pain which is getting worse or is uncontrolled with medicine.  You have a fever. MAKE SURE YOU:   Understand these instructions.  Will watch your condition.  Will get help right away if you are not doing well or get worse. Document Released: 10/13/2010 Document Revised: 07/09/2011 Document Reviewed: 10/13/2010 [facility] Patient Information 2014 [place], [place].

## 2013-10-07 NOTE — ED Provider Notes (Signed)
Terry Hull is a 58 y.o. female who presents to Urgent Care today for hemoptysis. Patient noted an episode of hemoptysis after doing some jump rope at work. She notes fatigue shortness of breath and some chest tightness. Patient denies any chest pain palpitations or presyncopal feelings. Patient notes that she had a nosebleed this morning. She is a former smoker.   Past Medical History  Diagnosis Date  . DIABETES MELLITUS, TYPE II     diet controlled  . HYPERTRIGLYCERIDEMIA   . HYPERTENSION   . ALLERGIC RHINITIS   . ARTHRITIS   . SLEEP APNEA   . Abnormal electrocardiogram     consistent with LVH hypertrophy   History  Substance Use Topics  . Smoking status: Current Every Day Smoker -- 1.00 packs/day  . Smokeless tobacco: Not on file  . Alcohol Use: Yes   ROS as above Medications: No current facility-administered medications for this encounter.   Current Outpatient Prescriptions  Medication Sig Dispense Refill  . albuterol (PROVENTIL HFA;VENTOLIN HFA) 108 (90 BASE) MCG/ACT inhaler Inhale 2 puffs into the lungs every 6 (six) hours as needed for wheezing or shortness of breath.  1 Inhaler  2  . ALPRAZolam (XANAX) 0.5 MG tablet TAKE 1 TABLET BY MOUTH THREE TIMES DAILY AS NEEDED FOR SLEEP  90 tablet  0  . amLODipine (NORVASC) 5 MG tablet TAKE 1 TABLET BY MOUTH DAILY  90 tablet  0  . aspirin 81 MG tablet Take 81 mg by mouth daily.        . carvedilol (COREG) 6.25 MG tablet TAKE 1 TABLET BY MOUTH TWICE DAILY WITH A MEAL  60 tablet  1  . citalopram (CELEXA) 20 MG tablet TAKE 1 TABLET BY MOUTH EVERY DAY  90 tablet  1  . diclofenac (VOLTAREN) 75 MG EC tablet TAKE 1 TABLET BY MOUTH TWICE DAILY  60 tablet  3  . glucose blood (ONE TOUCH TEST STRIPS) test strip Use check blood sugar before breakfast and bedtime  60 each  5  . hydrochlorothiazide (MICROZIDE) 12.5 MG capsule TAKE ONE CAPSULE BY MOUTH EVERY MORNING  90 capsule  0  . ipratropium (ATROVENT) 0.06 % nasal spray Place 2 sprays into  both nostrils 4 (four) times daily.  15 mL  1  . levofloxacin (LEVAQUIN) 500 MG tablet Take 1 tablet (500 mg total) by mouth daily.  7 tablet  0  . lisinopril (PRINIVIL,ZESTRIL) 40 MG tablet TAKE 1 TABLET BY MOUTH EVERY DAY  90 tablet  1  . methylPREDNISolone (MEDROL DOSEPAK) 4 MG tablet follow package directions, start on tues, take until finished  21 tablet  0  . ONE TOUCH LANCETS MISC Use to help check blood sugar twice a day  60 each  5  . pioglitazone (ACTOS) 45 MG tablet TAKE 1 TABLET BY MOUTH EVERY DAY  30 tablet  5  . pravastatin (PRAVACHOL) 20 MG tablet TAKE 1 TABLET BY MOUTH EVERY DAY  90 tablet  1  . promethazine-codeine (PHENERGAN WITH CODEINE) 6.25-10 MG/5ML syrup Take 5 mLs by mouth at bedtime as needed for cough.  120 mL  0    Exam:  BP 153/72  Pulse 70  Temp(Src) 98.7 F (37.1 C) (Oral)  Resp 16  SpO2 96% Gen: Well NAD HEENT: EOMI,  MMM normal nasal turbinates and tympanic membranes Lungs: Normal work of breathing. CTABL Heart: RRR no MRG Abd: NABS, Soft. NT, ND Exts: Brisk capillary refill, warm and well perfused.   Patient received a DuoNeb  nebulizer treatment, and felt a little better  No results found for this or any previous visit (from the past 24 hour(s)). Dg Chest 2 View  10/07/2013   CLINICAL DATA:  Hemoptysis  EXAM: CHEST  2 VIEW  COMPARISON:  January 05, 2013  FINDINGS: There is no edema or consolidation. Heart is upper normal in size with normal pulmonary vascularity. No adenopathy. No bone lesions.  IMPRESSION: No edema or consolidation.   Electronically Signed   By: Bretta BangWilliam  Woodruff M.D.   On: 10/07/2013 15:25    Assessment and Plan: 58 y.o. female with hemoptysis. Likely related to mild underlying bronchitis and also recent nosebleed. Will treat with albuterol Atrovent and codeine containing cough medication. Will use prednisone if not improving. Will avoid using prednisone early to prevent elevated blood sugar.  Discussed warning signs or symptoms.  Please see discharge instructions. Patient expresses understanding.    Rodolph BongEvan S Demetrica Zipp, MD 10/07/13 1550

## 2013-10-07 NOTE — ED Notes (Signed)
Dr Denyse Amass is in the room w/pt.  Pt reports blood when she coughs onset this afternoon about 2 hours ago Reports nose bleed this am  Sx also include weakness, SOB, chest tightness Alert w/no signs of acute distress.

## 2013-10-28 ENCOUNTER — Other Ambulatory Visit: Payer: Self-pay | Admitting: Internal Medicine

## 2013-11-02 ENCOUNTER — Other Ambulatory Visit: Payer: Self-pay | Admitting: Internal Medicine

## 2013-11-20 ENCOUNTER — Other Ambulatory Visit: Payer: Self-pay | Admitting: Internal Medicine

## 2013-11-25 ENCOUNTER — Other Ambulatory Visit: Payer: Self-pay | Admitting: Internal Medicine

## 2013-11-27 ENCOUNTER — Telehealth: Payer: Self-pay | Admitting: Internal Medicine

## 2013-11-27 MED ORDER — PIOGLITAZONE HCL 45 MG PO TABS: ORAL_TABLET | ORAL | Status: DC

## 2013-11-27 MED ORDER — PRAVASTATIN SODIUM 20 MG PO TABS: ORAL_TABLET | ORAL | Status: DC

## 2013-11-27 MED ORDER — LISINOPRIL 40 MG PO TABS: ORAL_TABLET | ORAL | Status: DC

## 2013-11-27 NOTE — Telephone Encounter (Signed)
Pt request refill for pioglitazone, lisinopril and pravastatin to be send to [facility] on [address] until she comes in for the appt 12/01/13. Thank you.

## 2013-11-30 ENCOUNTER — Other Ambulatory Visit: Payer: Self-pay | Admitting: Internal Medicine

## 2013-12-01 ENCOUNTER — Other Ambulatory Visit (INDEPENDENT_AMBULATORY_CARE_PROVIDER_SITE_OTHER): Payer: PRIVATE HEALTH INSURANCE

## 2013-12-01 ENCOUNTER — Ambulatory Visit (INDEPENDENT_AMBULATORY_CARE_PROVIDER_SITE_OTHER): Payer: PRIVATE HEALTH INSURANCE | Admitting: Internal Medicine

## 2013-12-01 ENCOUNTER — Encounter: Payer: Self-pay | Admitting: Internal Medicine

## 2013-12-01 VITALS — BP 152/80 | HR 68 | Temp 98.0°F | Ht 65.5 in | Wt 200.0 lb

## 2013-12-01 DIAGNOSIS — F411 Generalized anxiety disorder: Secondary | ICD-10-CM

## 2013-12-01 DIAGNOSIS — Z0000 Encounter for general adult medical examination without abnormal findings: Secondary | ICD-10-CM

## 2013-12-01 DIAGNOSIS — E119 Type 2 diabetes mellitus without complications: Secondary | ICD-10-CM

## 2013-12-01 DIAGNOSIS — Z1239 Encounter for other screening for malignant neoplasm of breast: Secondary | ICD-10-CM

## 2013-12-01 DIAGNOSIS — I10 Essential (primary) hypertension: Secondary | ICD-10-CM

## 2013-12-01 DIAGNOSIS — Z23 Encounter for immunization: Secondary | ICD-10-CM

## 2013-12-01 LAB — CBC WITH DIFFERENTIAL/PLATELET
BASOS ABS: 0 10*3/uL (ref 0.0–0.1)
Basophils Relative: 0.3 % (ref 0.0–3.0)
EOS ABS: 0.3 10*3/uL (ref 0.0–0.7)
Eosinophils Relative: 3 % (ref 0.0–5.0)
HEMATOCRIT: 37.2 % (ref 36.0–46.0)
Hemoglobin: 12.4 g/dL (ref 12.0–15.0)
LYMPHS ABS: 2.1 10*3/uL (ref 0.7–4.0)
Lymphocytes Relative: 22.6 % (ref 12.0–46.0)
MCHC: 33.4 g/dL (ref 30.0–36.0)
MCV: 94.9 fl (ref 78.0–100.0)
MONO ABS: 0.8 10*3/uL (ref 0.1–1.0)
Monocytes Relative: 8.6 % (ref 3.0–12.0)
NEUTROS ABS: 6.2 10*3/uL (ref 1.4–7.7)
Neutrophils Relative %: 65.5 % (ref 43.0–77.0)
Platelets: 321 10*3/uL (ref 150.0–400.0)
RBC: 3.92 Mil/uL (ref 3.87–5.11)
RDW: 15.1 % (ref 11.5–15.5)
WBC: 9.5 10*3/uL (ref 4.0–10.5)

## 2013-12-01 LAB — BASIC METABOLIC PANEL
BUN: 18 mg/dL (ref 6–23)
CO2: 29 meq/L (ref 19–32)
Calcium: 9.6 mg/dL (ref 8.4–10.5)
Chloride: 97 mEq/L (ref 96–112)
Creatinine, Ser: 0.8 mg/dL (ref 0.4–1.2)
GFR: 90.53 mL/min (ref >60.00)
GLUCOSE: 198 mg/dL — AB (ref 70–99)
POTASSIUM: 4.1 meq/L (ref 3.5–5.1)
SODIUM: 137 meq/L (ref 135–145)

## 2013-12-01 LAB — HEPATIC FUNCTION PANEL
ALT: 26 U/L (ref 0–35)
AST: 19 U/L (ref 0–37)
Albumin: 4 g/dL (ref 3.5–5.2)
Alkaline Phosphatase: 72 U/L (ref 39–117)
BILIRUBIN TOTAL: 0.4 mg/dL (ref 0.2–1.2)
Bilirubin, Direct: 0.1 mg/dL (ref 0.0–0.3)
Total Protein: 7.4 g/dL (ref 6.0–8.3)

## 2013-12-01 LAB — MICROALBUMIN / CREATININE URINE RATIO
Creatinine,U: 56.4 mg/dL
Microalb Creat Ratio: 2.8 mg/g (ref 0.0–30.0)
Microalb, Ur: 1.6 mg/dL (ref 0.0–1.9)

## 2013-12-01 LAB — LDL CHOLESTEROL, DIRECT: LDL DIRECT: 108.5 mg/dL

## 2013-12-01 LAB — LIPID PANEL
CHOLESTEROL: 223 mg/dL — AB (ref 0–200)
HDL: 54.7 mg/dL (ref >39.00)
NonHDL: 168.3
Total CHOL/HDL Ratio: 4
VLDL: 80 mg/dL — AB (ref 0.0–40.0)

## 2013-12-01 LAB — TSH: TSH: 0.74 u[IU]/mL (ref 0.35–4.50)

## 2013-12-01 LAB — HEMOGLOBIN A1C: Hgb A1c MFr Bld: 8.7 % — ABNORMAL HIGH (ref 4.6–6.5)

## 2013-12-01 MED ORDER — LIRAGLUTIDE 18 MG/3ML ~~LOC~~ SOPN: 1.8000 mg | PEN_INJECTOR | Freq: Every day | SUBCUTANEOUS | Status: DC

## 2013-12-01 MED ORDER — AMLODIPINE BESYLATE 5 MG PO TABS: 5.0000 mg | ORAL_TABLET | Freq: Every day | ORAL | Status: DC

## 2013-12-01 MED ORDER — PRAVASTATIN SODIUM 20 MG PO TABS: 20.0000 mg | ORAL_TABLET | Freq: Every day | ORAL | Status: DC

## 2013-12-01 MED ORDER — LISINOPRIL 40 MG PO TABS: ORAL_TABLET | ORAL | Status: DC

## 2013-12-01 MED ORDER — HYDROCHLOROTHIAZIDE 12.5 MG PO CAPS: 12.5000 mg | ORAL_CAPSULE | Freq: Every day | ORAL | Status: DC

## 2013-12-01 MED ORDER — CITALOPRAM HYDROBROMIDE 20 MG PO TABS: 20.0000 mg | ORAL_TABLET | Freq: Every day | ORAL | Status: DC

## 2013-12-01 MED ORDER — CARVEDILOL 6.25 MG PO TABS: ORAL_TABLET | ORAL | Status: DC

## 2013-12-01 NOTE — Assessment & Plan Note (Addendum)
Diet controlled until 2013 - rx Actos due to a1c >10 but no follow up since check a1c, Cr and microalb now - also lipids  On ACEI and statin Reviewed weight gain and influence of same on glycemic control Will DC actos and start victoza - education on same  Lab Results  Component Value Date   HGBA1C  Value: 6.5 (NOTE)                                                           02/05/2010   Lab Results  Component Value Date   HGBA1C 6.9* 10/24/2012

## 2013-12-01 NOTE — Assessment & Plan Note (Signed)
BP Readings from Last 3 Encounters:  12/01/13 152/80  10/07/13 153/72  01/05/13 174/65   The current medical regimen is generally effective;  continue present plan and medications.

## 2013-12-01 NOTE — Progress Notes (Signed)
Pre visit review using our clinic review tool, if applicable. No additional management support is needed unless otherwise documented below in the visit note. 

## 2013-12-01 NOTE — Progress Notes (Signed)
Subjective:    Patient ID: Terry Hull, female    DOB: 02-21-1956, 58 y.o.   MRN: [id number]  HPI  patient is here today for annual physical. Patient feels well and has no complaints.  Also reviewed chronic medical issues and interval medical events  Past Medical History  Diagnosis Date  . DIABETES MELLITUS, TYPE II     diet controlled  . HYPERTRIGLYCERIDEMIA   . HYPERTENSION   . ALLERGIC RHINITIS   . ARTHRITIS   . SLEEP APNEA   . Abnormal electrocardiogram     consistent with LVH hypertrophy   Family History  Problem Relation Age of Onset  . Cancer Mother     Ovarian Cancer  . Hyperlipidemia Mother   . Hypertension Mother   . Diabetes Other     1st degree relative   History  Substance Use Topics  . Smoking status: Former Smoker -- 1.00 packs/day    Quit date: 12/29/2012  . Smokeless tobacco: Not on file  . Alcohol Use: Yes    Review of Systems  Constitutional: Positive for fatigue (>3 mo, physical > emotional) and unexpected weight change (gain without clear explaination).  Respiratory: Negative for cough, shortness of breath and wheezing.   Cardiovascular: Negative for chest pain, palpitations and leg swelling.  Gastrointestinal: Negative for nausea, abdominal pain and diarrhea.  Musculoskeletal: Positive for back pain (chronic ache).  Neurological: Negative for dizziness, weakness, light-headedness and headaches.  Psychiatric/Behavioral: Negative for dysphoric mood. The patient is not nervous/anxious.   All other systems reviewed and are negative.      Objective:   Physical Exam  BP 152/80  Pulse 68  Temp(Src) 98 F (36.7 C) (Oral)  Ht 5' 5.5" (1.664 m)  Wt 200 lb (90.719 kg)  BMI 32.76 kg/m2  SpO2 98% Wt Readings from Last 3 Encounters:  12/01/13 200 lb (90.719 kg)  10/24/12 184 lb 6.4 oz (83.643 kg)  09/15/11 159 lb 6.4 oz (72.303 kg)   Constitutional: She is obese, and appears well-developed and well-nourished. No distress.  HENT: Head:  Normocephalic and atraumatic. Ears: B TMs ok, no erythema or effusion; Nose: Nose normal. Mouth/Throat: Oropharynx is clear and moist. No oropharyngeal exudate.  Eyes: Conjunctivae and EOM are normal. Pupils are equal, round, and reactive to light. No scleral icterus.  Neck: Normal range of motion. Neck supple. No JVD present. No thyromegaly present.  Cardiovascular: Normal rate, regular rhythm and normal heart sounds.  No murmur heard. No BLE edema. Pulmonary/Chest: Effort normal and breath sounds normal. No respiratory distress. She has no wheezes.  Abdominal: Soft. Bowel sounds are normal. She exhibits no distension. There is no tenderness. no masses GU/breast: defer to gyn Musculoskeletal: Normal range of motion, no joint effusions. No gross deformities Neurological: She is alert and oriented to person, place, and time. No cranial nerve deficit. Coordination, balance, strength, speech and gait are normal.  Skin: Skin is warm and dry. No rash noted. No erythema.  Psychiatric: She has a normal mood and affect. Her behavior is normal. Judgment and thought content normal.    Lab Results  Component Value Date   WBC 11.1* 07/13/2011   HGB 14.5 07/13/2011   HCT 42.3 07/13/2011   PLT 282.0 07/13/2011   GLUCOSE 167* 10/24/2012   CHOL 188 10/24/2012   TRIG 254.0* 10/24/2012   HDL 62.70 10/24/2012   LDLDIRECT 79.4 10/24/2012   LDLCALC  Value: 54 (NOTE)  Total Cholesterol/HDL Ratio:CHD Risk  Coronary Heart Disease Risk Table                                       Men       Women         1/2 Average Risk              3.4        3.3             Average Risk              5.0         4.4         2 X Average Risk              9.6        7.1         3 X Average Risk             23.4       11.0 Use the calculated Patient Ratio above and the CHD Risk table  to determine the patient's CHD Risk. ATP III Classification (LDL):      < 100         mg/dL         Optimal     161100 - 129     mg/dL          Near or Above Optimal     130 - 159     mg/dL         Borderline High     160 - 189     mg/dL         High      > 096190        mg/dL         Very High  04/5/409810/12/2009   ALT 31 07/13/2011   AST 20 07/13/2011   NA 139 10/24/2012   K 4.1 10/24/2012   CL 101 10/24/2012   CREATININE 0.8 10/24/2012   BUN 16 10/24/2012   CO2 33* 10/24/2012   TSH 0.71 07/13/2011   HGBA1C 6.9* 10/24/2012   MICROALBUR 1.5 10/24/2012    Dg Chest 2 View  10/07/2013   CLINICAL DATA:  Hemoptysis  EXAM: CHEST  2 VIEW  COMPARISON:  January 05, 2013  FINDINGS: There is no edema or consolidation. Heart is upper normal in size with normal pulmonary vascularity. No adenopathy. No bone lesions.  IMPRESSION: No edema or consolidation.   Electronically Signed   By: Bretta BangWilliam  Woodruff M.D.   On: 10/07/2013 15:25       Assessment & Plan:   CPX/v70.0 - Patient has been counseled on age-appropriate routine health concerns for screening and prevention. These are reviewed and up-to-date. Immunizations are up-to-date or declined. Labs ordered and reviewed.  Weight gain since satisfactory cessation mid 2014. Review and discussion of same. No other offending meds evident. Will DC Actos and begin Victoza for diabetes to aide weight loss goals  Problem List Items Addressed This Visit   Anxiety state, unspecified     On SSRI and prn BZ since 04/2010 - mood stable The current medical regimen is effective;  continue present plan and medications.     Relevant Medications      citalopram (CELEXA) tablet   DIABETES MELLITUS, TYPE II      Diet controlled until 2013 - rx Actos due to a1c >  10 but no follow up since check a1c, Cr and microalb now - also lipids  On ACEI and statin Reviewed weight gain and influence of same on glycemic control Will DC actos and start victoza - education on same  Lab Results  Component Value Date   HGBA1C  Value: 6.5 (NOTE)                                                           02/05/2010   Lab Results  Component  Value Date   HGBA1C 6.9* 10/24/2012       Relevant Medications      Liraglutide (VICTOZA) 18 MG/3ML SOPN      pravastatin (PRAVACHOL) tablet      lisinopril (PRINIVIL,ZESTRIL) tablet   Other Relevant Orders      Basic metabolic panel      Lipid panel      Hemoglobin A1c      Microalbumin / creatinine urine ratio   HYPERTENSION      BP Readings from Last 3 Encounters:  12/01/13 152/80  10/07/13 153/72  01/05/13 174/65   The current medical regimen is generally effective;  continue present plan and medications.      Relevant Medications      amLODIpine (NORVASC) tablet      carvedilol (COREG) tablet      hydrochlorothiazide (MICROZIDE) 12.5 MG capsule      pravastatin (PRAVACHOL) tablet      lisinopril (PRINIVIL,ZESTRIL) tablet    Other Visit Diagnoses   Routine general medical examination at a health care facility    -  Primary    Relevant Orders       Basic metabolic panel       CBC with Differential       Hepatic function panel       Lipid panel       TSH    Other screening breast examination        Relevant Orders       MM DIGITAL SCREENING BILATERAL

## 2013-12-01 NOTE — Assessment & Plan Note (Signed)
On SSRI and prn BZ since 04/2010 - mood stable The current medical regimen is effective;  continue present plan and medications.

## 2013-12-01 NOTE — Patient Instructions (Addendum)
It was good to see you today.  We have reviewed your prior records including labs and tests today  Health Maintenance reviewed - refer for mammogram at [facility] and pneumonia vaccine updated today -all other recommended immunizations and age-appropriate screenings are up-to-date.  Test(s) ordered today. Your results will be released to Terry Hull (or called to you) after review, usually within 72hours after test completion. If any changes need to be made, you will be notified at that same time.  Medications reviewed and updated Actos and begin Victoza for diabetes as discussed -start 0.6 mg daily for one week, then 1.2 mg daily for one week then 1.8 mg daily. If severe nausea at any stage, hold current dose and contact me if problems No other changes recommended at this time.  Please schedule followup in 3-6 months for diabetes and weight check/ labs, call sooner if problems.  Health Maintenance Adopting a healthy lifestyle and getting preventive care can go a long way to promote health and wellness. Talk with your health care provider about what schedule of regular examinations is right for you. This is a good chance for you to check in with your provider about disease prevention and staying healthy. In between checkups, there are plenty of things you can do on your own. Experts have done a lot of research about which lifestyle changes and preventive measures are most likely to keep you healthy. Ask your health care provider for more information. WEIGHT AND DIET  Eat a healthy diet  Be sure to include plenty of vegetables, fruits, low-fat dairy products, and lean protein.  Do not eat a lot of foods high in solid fats, added sugars, or salt.  Get regular exercise. This is one of the most important things you can do for your health.  Most adults should exercise for at least 150 minutes each week. The exercise should increase your heart rate and make you sweat (moderate-intensity exercise).  Most  adults should also do strengthening exercises at least twice a week. This is in addition to the moderate-intensity exercise.  Maintain a healthy weight  Body mass index (BMI) is a measurement that can be used to identify possible weight problems. It estimates body fat based on height and weight. Your health care provider can help determine your BMI and help you achieve or maintain a healthy weight.  For females 60 years of age and older:   A BMI below 18.5 is considered underweight.  A BMI of 18.5 to 24.9 is normal.  A BMI of 25 to 29.9 is considered overweight.  A BMI of 30 and above is considered obese.  Watch levels of cholesterol and blood lipids  You should start having your blood tested for lipids and cholesterol at 59 years of age, then have this test every 5 years.  You may need to have your cholesterol levels checked more often if:  Your lipid or cholesterol levels are high.  You are older than 59 years of age.  You are at high risk for heart disease.  CANCER SCREENING   Lung Cancer  Lung cancer screening is recommended for adults 63-90 years old who are at high risk for lung cancer because of a history of smoking.  A yearly low-dose CT scan of the lungs is recommended for people who:  Currently smoke.  Have quit within the past 15 years.  Have at least a 30-pack-year history of smoking. A pack year is smoking an average of one pack of cigarettes a day  for 1 year.  Yearly screening should continue until it has been 15 years since you quit.  Yearly screening should stop if you develop a health problem that would prevent you from having lung cancer treatment.  Breast Cancer  Practice breast self-awareness. This means understanding how your breasts normally appear and feel.  It also means doing regular breast self-exams. Let your health care provider know about any changes, no matter how small.  If you are in your 20s or 30s, you should have a clinical  breast exam (CBE) by a health care provider every 1-3 years as part of a regular health exam.  If you are 59 or older, have a CBE every year. Also consider having a breast X-ray (mammogram) every year.  If you have a family history of breast cancer, talk to your health care provider about genetic screening.  If you are at high risk for breast cancer, talk to your health care provider about having an MRI and a mammogram every year.  Breast cancer gene (BRCA) assessment is recommended for women who have family members with BRCA-related cancers. BRCA-related cancers include:  Breast.  Ovarian.  Tubal.  Peritoneal cancers.  Results of the assessment will determine the need for genetic counseling and BRCA1 and BRCA2 testing. Cervical Cancer Routine pelvic examinations to screen for cervical cancer are no longer recommended for nonpregnant women who are considered low risk for cancer of the pelvic organs (ovaries, uterus, and vagina) and who do not have symptoms. A pelvic examination may be necessary if you have symptoms including those associated with pelvic infections. Ask your health care provider if a screening pelvic exam is right for you.   The Pap test is the screening test for cervical cancer for women who are considered at risk.  If you had a hysterectomy for a problem that was not cancer or a condition that could lead to cancer, then you no longer need Pap tests.  If you are older than 65 years, and you have had normal Pap tests for the past 10 years, you no longer need to have Pap tests.  If you have had past treatment for cervical cancer or a condition that could lead to cancer, you need Pap tests and screening for cancer for at least 20 years after your treatment.  If you no longer get a Pap test, assess your risk factors if they change (such as having a new sexual partner). This can affect whether you should start being screened again.  Some women have medical problems that  increase their chance of getting cervical cancer. If this is the case for you, your health care provider may recommend more frequent screening and Pap tests.  The human papillomavirus (HPV) test is another test that may be used for cervical cancer screening. The HPV test looks for the virus that can cause cell changes in the cervix. The cells collected during the Pap test can be tested for HPV.  The HPV test can be used to screen women 58 years of age and older. Getting tested for HPV can extend the interval between normal Pap tests from three to five years.  An HPV test also should be used to screen women of any age who have unclear Pap test results.  After 59 years of age, women should have HPV testing as often as Pap tests.  Colorectal Cancer  This type of cancer can be detected and often prevented.  Routine colorectal cancer screening usually begins at 59 years  of age and continues through 59 years of age.  Your health care provider may recommend screening at an earlier age if you have risk factors for colon cancer.  Your health care provider may also recommend using home test kits to check for hidden blood in the stool.  A small camera at the end of a tube can be used to examine your colon directly (sigmoidoscopy or colonoscopy). This is done to check for the earliest forms of colorectal cancer.  Routine screening usually begins at age 60.  Direct examination of the colon should be repeated every 5-10 years through 59 years of age. However, you may need to be screened more often if early forms of precancerous polyps or small growths are found. Skin Cancer  Check your skin from head to toe regularly.  Tell your health care provider about any new moles or changes in moles, especially if there is a change in a mole's shape or color.  Also tell your health care provider if you have a mole that is larger than the size of a pencil eraser.  Always use sunscreen. Apply sunscreen  liberally and repeatedly throughout the day.  Protect yourself by wearing long sleeves, pants, a wide-brimmed hat, and sunglasses whenever you are outside. HEART DISEASE, DIABETES, AND HIGH BLOOD PRESSURE   Have your blood pressure checked at least every 1-2 years. High blood pressure causes heart disease and increases the risk of stroke.  If you are between 21 years and 69 years old, ask your health care provider if you should take aspirin to prevent strokes.  Have regular diabetes screenings. This involves taking a blood sample to check your fasting blood sugar level.  If you are at a normal weight and have a low risk for diabetes, have this test once every three years after 59 years of age.  If you are overweight and have a high risk for diabetes, consider being tested at a younger age or more often. PREVENTING INFECTION  Hepatitis B  If you have a higher risk for hepatitis B, you should be screened for this virus. You are considered at high risk for hepatitis B if:  You were born in a country where hepatitis B is common. Ask your health care provider which countries are considered high risk.  Your parents were born in a high-risk country, and you have not been immunized against hepatitis B (hepatitis B vaccine).  You have HIV or AIDS.  You use needles to inject street drugs.  You live with someone who has hepatitis B.  You have had sex with someone who has hepatitis B.  You get hemodialysis treatment.  You take certain medicines for conditions, including cancer, organ transplantation, and autoimmune conditions. Hepatitis C  Blood testing is recommended for:  Everyone born from [birth year] through 1966.  Anyone with known risk factors for hepatitis C. Sexually transmitted infections (STIs)  You should be screened for sexually transmitted infections (STIs) including gonorrhea and chlamydia if:  You are sexually active and are younger than 59 years of age.  You are older than  59 years of age and your health care provider tells you that you are at risk for this type of infection.  Your sexual activity has changed since you were last screened and you are at an increased risk for chlamydia or gonorrhea. Ask your health care provider if you are at risk.  If you do not have HIV, but are at risk, it may be recommended that you take  a prescription medicine daily to prevent HIV infection. This is called pre-exposure prophylaxis (PrEP). You are considered at risk if:  You are sexually active and do not regularly use condoms or know the HIV status of your partner(s).  You take drugs by injection.  You are sexually active with a partner who has HIV. Talk with your health care provider about whether you are at high risk of being infected with HIV. If you choose to begin PrEP, you should first be tested for HIV. You should then be tested every 3 months for as long as you are taking PrEP.  PREGNANCY   If you are premenopausal and you may become pregnant, ask your health care provider about preconception counseling.  If you may become pregnant, take 400 to 800 micrograms (mcg) of folic acid every day.  If you want to prevent pregnancy, talk to your health care provider about birth control (contraception). OSTEOPOROSIS AND MENOPAUSE   Osteoporosis is a disease in which the bones lose minerals and strength with aging. This can result in serious bone fractures. Your risk for osteoporosis can be identified using a bone density scan.  If you are 48 years of age or older, or if you are at risk for osteoporosis and fractures, ask your health care provider if you should be screened.  Ask your health care provider whether you should take a calcium or vitamin D supplement to lower your risk for osteoporosis.  Menopause may have certain physical symptoms and risks.  Hormone replacement therapy may reduce some of these symptoms and risks. Talk to your health care provider about  whether hormone replacement therapy is right for you.  HOME CARE INSTRUCTIONS   Schedule regular health, dental, and eye exams.  Stay current with your immunizations.   Do not use any tobacco products including cigarettes, chewing tobacco, or electronic cigarettes.  If you are pregnant, do not drink alcohol.  If you are breastfeeding, limit how much and how often you drink alcohol.  Limit alcohol intake to no more than 1 drink per day for nonpregnant women. One drink equals 12 ounces of beer, 5 ounces of wine, or 1 ounces of hard liquor.  Do not use street drugs.  Do not share needles.  Ask your health care provider for help if you need support or information about quitting drugs.  Tell your health care provider if you often feel depressed.  Tell your health care provider if you have ever been abused or do not feel safe at home. Document Released: 10/30/2010 Document Revised: 08/31/2013 Document Reviewed: 03/18/2013 [facility] Patient Information 2015 [place], [place]. This information is not intended to replace advice given to you by your health care provider. Make sure you discuss any questions you have with your health care provider. Diabetes and Standards of Medical Care Diabetes is complicated. You may find that your diabetes team includes a dietitian, nurse, diabetes educator, eye doctor, and more. To help everyone know what is going on and to help you get the care you deserve, the following schedule of care was developed to help keep you on track. Below are the tests, exams, vaccines, medicines, education, and plans you will need. HbA1c test This test shows how well you have controlled your glucose over the past 2-3 months. It is used to see if your diabetes management plan needs to be adjusted.   It is performed at least 2 times a year if you are meeting treatment goals.  It is performed 4 times a  year if therapy has changed or if you are not meeting treatment goals. Blood  pressure test  This test is performed at every routine medical visit. The goal is less than 140/90 mm Hg for most people, but 130/80 mm Hg in some cases. Ask your health care provider about your goal. Dental exam  Follow up with the dentist regularly. Eye exam  If you are diagnosed with type 1 diabetes as a child, get an exam upon reaching the age of 75 years or older and have had diabetes for 3-5 years. Yearly eye exams are recommended after that initial eye exam.  If you are diagnosed with type 1 diabetes as an adult, get an exam within 5 years of diagnosis and then yearly.  If you are diagnosed with type 2 diabetes, get an exam as soon as possible after the diagnosis and then yearly. Foot care exam  Visual foot exams are performed at every routine medical visit. The exams check for cuts, injuries, or other problems with the feet.  A comprehensive foot exam should be done yearly. This includes visual inspection as well as assessing foot pulses and testing for loss of sensation.  Check your feet nightly for cuts, injuries, or other problems with your feet. Tell your health care provider if anything is not healing. Kidney function test (urine microalbumin)  This test is performed once a year.  Type 1 diabetes: The first test is performed 5 years after diagnosis.  Type 2 diabetes: The first test is performed at the time of diagnosis.  A serum creatinine and estimated glomerular filtration rate (eGFR) test is done once a year to assess the level of chronic kidney disease (CKD), if present. Lipid profile (cholesterol, HDL, LDL, triglycerides)  Performed every 5 years for most people.  The goal for LDL is less than 100 mg/dL. If you are at high risk, the goal is less than 70 mg/dL.  The goal for HDL is 40 mg/dL-50 mg/dL for men and 50 mg/dL-60 mg/dL for women. An HDL cholesterol of 60 mg/dL or higher gives some protection against heart disease.  The goal for triglycerides is less  than 150 mg/dL. Influenza vaccine, pneumococcal vaccine, and hepatitis B vaccine  The influenza vaccine is recommended yearly.  It is recommended that people with diabetes who are over 61 years old get the pneumonia vaccine. In some cases, two separate shots may be given. Ask your health care provider if your pneumonia vaccination is up to date.  The hepatitis B vaccine is also recommended for adults with diabetes. Diabetes self-management education  Education is recommended at diagnosis and ongoing as needed. Treatment plan  Your treatment plan is reviewed at every medical visit. Document Released: 02/11/2009 Document Revised: 08/31/2013 Document Reviewed: 09/16/2012 [facility] Patient Information 2015 [place], [place]. This information is not intended to replace advice given to you by your health care provider. Make sure you discuss any questions you have with your health care provider.

## 2013-12-02 ENCOUNTER — Other Ambulatory Visit: Payer: Self-pay | Admitting: *Deleted

## 2013-12-02 MED ORDER — INSULIN PEN NEEDLE 32G X 4 MM MISC: 1.0000 "pen " | Freq: Every day | Status: DC

## 2013-12-02 NOTE — Telephone Encounter (Signed)
Left msg on triage stating md sent rx for victoza on yesterday, but she is needing the pen needles as well...Raechel Chute/lmb

## 2013-12-09 ENCOUNTER — Other Ambulatory Visit: Payer: Self-pay | Admitting: Internal Medicine

## 2013-12-28 ENCOUNTER — Other Ambulatory Visit: Payer: Self-pay | Admitting: Internal Medicine

## 2013-12-29 LAB — HM MAMMOGRAPHY

## 2014-01-02 ENCOUNTER — Other Ambulatory Visit: Payer: Self-pay | Admitting: Internal Medicine

## 2014-01-14 ENCOUNTER — Encounter: Payer: Self-pay | Admitting: Internal Medicine

## 2014-01-18 ENCOUNTER — Other Ambulatory Visit: Payer: Self-pay | Admitting: Internal Medicine

## 2014-01-28 ENCOUNTER — Other Ambulatory Visit: Payer: Self-pay | Admitting: Internal Medicine

## 2014-01-28 NOTE — Telephone Encounter (Signed)
Refill done.  

## 2014-01-28 NOTE — Telephone Encounter (Signed)
Pt had an annual exam OV with you 12/01/13--Rx last filled by Nicki Reaperegina Baity 01/2013--please advise

## 2014-01-28 NOTE — Telephone Encounter (Signed)
Md printed. This has been signed and faxed.

## 2014-02-12 ENCOUNTER — Telehealth: Payer: Self-pay | Admitting: *Deleted

## 2014-02-12 MED ORDER — LIRAGLUTIDE 18 MG/3ML ~~LOC~~ SOPN: 1.8000 mg | PEN_INJECTOR | Freq: Every day | SUBCUTANEOUS | Status: DC

## 2014-02-12 MED ORDER — INSULIN PEN NEEDLE 32G X 4 MM MISC: 1.0000 "pen " | Freq: Every day | Status: DC

## 2014-02-12 NOTE — Addendum Note (Signed)
Addended by: Deatra JamesBRAND, Waylen Depaolo M on: 02/12/2014 02:57 PM   Modules accepted: Orders

## 2014-02-12 NOTE — Telephone Encounter (Signed)
[place] [place] and forgot to [place] insulin. Prescriptions for needles and [facility]

## 2014-02-12 NOTE — Telephone Encounter (Signed)
Called pt verified which pharmacy she wantmeds sent to. [facility] is using the [facility] [address] @ [phone]. Sent rx electronically...Raechel Chute/lmb

## 2014-02-12 NOTE — Telephone Encounter (Signed)
Ok to call in rx

## 2014-02-22 ENCOUNTER — Other Ambulatory Visit: Payer: Self-pay | Admitting: Internal Medicine

## 2014-03-31 ENCOUNTER — Other Ambulatory Visit: Payer: Self-pay | Admitting: Internal Medicine

## 2014-04-16 ENCOUNTER — Other Ambulatory Visit: Payer: Self-pay | Admitting: Hematology and Oncology

## 2014-07-22 ENCOUNTER — Other Ambulatory Visit: Payer: Self-pay | Admitting: Internal Medicine

## 2014-09-07 ENCOUNTER — Other Ambulatory Visit: Payer: Self-pay | Admitting: Internal Medicine

## 2014-09-07 NOTE — Telephone Encounter (Signed)
Xanax already refilled earlier today apparently

## 2014-09-07 NOTE — Telephone Encounter (Signed)
MD out of office pls advise on refill.../lmb 

## 2014-10-18 ENCOUNTER — Telehealth: Payer: Self-pay | Admitting: *Deleted

## 2014-10-18 NOTE — Telephone Encounter (Signed)
Left message on patients vm asking her to call and schedule her a1c and ov with Dr. Felicity Coyer.

## 2014-11-02 ENCOUNTER — Other Ambulatory Visit: Payer: Self-pay | Admitting: Internal Medicine

## 2014-11-04 ENCOUNTER — Ambulatory Visit: Payer: PRIVATE HEALTH INSURANCE | Admitting: Internal Medicine

## 2014-11-09 ENCOUNTER — Ambulatory Visit (INDEPENDENT_AMBULATORY_CARE_PROVIDER_SITE_OTHER): Payer: PRIVATE HEALTH INSURANCE | Admitting: Family

## 2014-11-09 ENCOUNTER — Encounter: Payer: Self-pay | Admitting: Family

## 2014-11-09 ENCOUNTER — Other Ambulatory Visit (INDEPENDENT_AMBULATORY_CARE_PROVIDER_SITE_OTHER): Payer: PRIVATE HEALTH INSURANCE

## 2014-11-09 VITALS — BP 136/80 | HR 71 | Temp 98.1°F | Resp 18 | Ht 65.5 in | Wt 178.8 lb

## 2014-11-09 DIAGNOSIS — E1165 Type 2 diabetes mellitus with hyperglycemia: Secondary | ICD-10-CM

## 2014-11-09 DIAGNOSIS — IMO0002 Reserved for concepts with insufficient information to code with codable children: Secondary | ICD-10-CM

## 2014-11-09 DIAGNOSIS — M1990 Unspecified osteoarthritis, unspecified site: Secondary | ICD-10-CM

## 2014-11-09 LAB — BASIC METABOLIC PANEL
BUN: 11 mg/dL (ref 6–23)
CO2: 30 mEq/L (ref 19–32)
CREATININE: 0.94 mg/dL (ref 0.40–1.20)
Calcium: 9.7 mg/dL (ref 8.4–10.5)
Chloride: 96 mEq/L (ref 96–112)
GFR: 78.17 mL/min (ref >60.00)
GLUCOSE: 320 mg/dL — AB (ref 70–99)
Potassium: 3.8 mEq/L (ref 3.5–5.1)
SODIUM: 137 meq/L (ref 135–145)

## 2014-11-09 LAB — HEMOGLOBIN A1C: HEMOGLOBIN A1C: 9.1 % — AB (ref 4.6–6.5)

## 2014-11-09 MED ORDER — DICLOFENAC SODIUM 75 MG PO TBEC: 75.0000 mg | DELAYED_RELEASE_TABLET | Freq: Two times a day (BID) | ORAL | Status: DC | PRN

## 2014-11-09 NOTE — Patient Instructions (Signed)
Thank you for choosing [facility].  Summary/Instructions:  Your prescription(s) have been submitted to your pharmacy or been printed and provided for you. Please take as directed and contact our office if you believe you are having problem(s) with the medication(s) or have any questions.  Please stop by the lab on the basement level of the building for your blood work. Your results will be released to MyChart (or called to you) after review, usually within 72 hours after test completion. If any changes need to be made, you will be notified at that same time.  If your symptoms worsen or fail to improve, please contact our office for further instruction, or in case of emergency go directly to the emergency room at the closest medical facility.    Please use the diclofenac as needed for mild/moderate pain/inflammation.   Please schedule a diabetic eye exam at your convenience.

## 2014-11-09 NOTE — Assessment & Plan Note (Signed)
Continues to experience symptoms of arthritis mostly in her right upper extremity and is most likely related to overuse. Discussed possible cortisone injection with orthopedics. Patient wishes to continue with diclofenac at this time. Continue current dosage of diclofenac. Updated prescription to as needed. Discussed risks of cardiovascular disease with long-term usage of prescription anti-inflammatory usage. Follow-up if symptoms worsen or fail to improve with current regimen.

## 2014-11-09 NOTE — Assessment & Plan Note (Signed)
Type II. Diabetes remains uncontrolled previous A1c is 8.7. She is maintaining on Victoza. Continue current dosage of her toes at this time. Obtain A1c and his metabolic panel. Patient will schedule diabetic eye exam independently. Follow-up pending lab work.

## 2014-11-09 NOTE — Progress Notes (Signed)
Subjective:    Patient ID: Terry Hull, female    DOB: 12/12/1955, 59 y.o.   MRN: [id number]  Chief Complaint  Patient presents with  . Medication Refill    would like refill of diclofenac    HPI:  Terry BruinRobin B Sellitto is a 59 y.o. female with a PMH of hypertension, allergic rhinitis, type 2 diabetes, hypertriglyceridemia, and tobacco use who presents today for a follow-up office visit.  1.) Right arm / wrist pain - Previously diagnosed with right arm/wrist pain that was consistent with impingement and DeQuervain and was started on diclofenac for pain and inflammation. Takes the medication as prescribed and denies adverse side effects. Reports improvements when taking the medication.   2.) Type 2 diabetes - Currently maintained on Victoza. Takes the medication and reports the occasional side effect of mild heartburn. Has not taken her blood sugars at home recently. Last eye exam was about 3 years and is overdue.   Lab Results  Component Value Date   HGBA1C 8.7* 12/01/2013    No Known Allergies   Current Outpatient Prescriptions on File Prior to Visit  Medication Sig Dispense Refill  . albuterol (PROVENTIL HFA;VENTOLIN HFA) 108 (90 BASE) MCG/ACT inhaler Inhale 2 puffs into the lungs every 6 (six) hours as needed for wheezing or shortness of breath. 1 Inhaler 2  . ALPRAZolam (XANAX) 0.5 MG tablet Take 1 tablet (0.5 mg total) by mouth 3 (three) times daily as needed for sleep. 90 tablet 0  . amLODipine (NORVASC) 5 MG tablet Take 1 tablet (5 mg total) by mouth daily. 30 tablet 11  . aspirin 81 MG tablet Take 81 mg by mouth daily.      . carvedilol (COREG) 6.25 MG tablet TAKE 1 TABLET BY MOUTH TWICE DAILY WITH MEALS 180 tablet 2  . citalopram (CELEXA) 20 MG tablet Take 1 tablet (20 mg total) by mouth daily. 30 tablet 11  . glucose blood (ONE TOUCH TEST STRIPS) test strip Use check blood sugar before breakfast and bedtime 60 each 5  . hydrochlorothiazide (MICROZIDE) 12.5 MG capsule Take  1 capsule (12.5 mg total) by mouth daily. 30 capsule 11  . Insulin Pen Needle (NOVOFINE PLUS) 32G X 4 MM MISC 1 pen by Does not apply route daily. 30 each 0  . lisinopril (PRINIVIL,ZESTRIL) 40 MG tablet TAKE 1 TABLET BY MOUTH EVERY DAY 30 tablet 0  . lisinopril (PRINIVIL,ZESTRIL) 40 MG tablet TAKE 1 TABLET BY MOUTH EVERY DAY 90 tablet 2  . ONE TOUCH LANCETS MISC Use to help check blood sugar twice a day 60 each 5  . pravastatin (PRAVACHOL) 20 MG tablet Take 1 tablet (20 mg total) by mouth daily. 30 tablet 11  . pravastatin (PRAVACHOL) 20 MG tablet TAKE 1 TABLET BY MOUTH EVERY DAY 30 tablet 0  . VICTOZA 18 MG/3ML SOPN INJECT 1.8 MG UNDER THE SKIN DAILY 6 mL 11   No current facility-administered medications on file prior to visit.    Review of Systems  Constitutional: Negative for fever and chills.  Endocrine: Positive for polyuria. Negative for polydipsia and polyphagia.  Musculoskeletal: Positive for arthralgias.      Objective:    BP 136/80 mmHg  Pulse 71  Temp(Src) 98.1 F (36.7 C) (Oral)  Resp 18  Ht 5' 5.5" (1.664 m)  Wt 178 lb 12.8 oz (81.103 kg)  BMI 29.29 kg/m2  SpO2 96% Nursing note and vital signs reviewed.  Physical Exam  Constitutional: She is oriented to person, place, and  time. She appears well-developed and well-nourished. No distress.  Cardiovascular: Normal rate, regular rhythm, normal heart sounds and intact distal pulses.   Pulmonary/Chest: Effort normal and breath sounds normal.  Neurological: She is alert and oriented to person, place, and time.  Skin: Skin is warm and dry.  Psychiatric: She has a normal mood and affect. Her behavior is normal. Judgment and thought content normal.       Assessment & Plan:   Problem List Items Addressed This Visit      Musculoskeletal and Integument   Arthritis    Continues to experience symptoms of arthritis mostly in her right upper extremity and is most likely related to overuse. Discussed possible cortisone  injection with orthopedics. Patient wishes to continue with diclofenac at this time. Continue current dosage of diclofenac. Updated prescription to as needed. Discussed risks of cardiovascular disease with long-term usage of prescription anti-inflammatory usage. Follow-up if symptoms worsen or fail to improve with current regimen.      Relevant Medications   diclofenac (VOLTAREN) 75 MG EC tablet     Other   Type 2 diabetes mellitus, uncontrolled - Primary    Type II. Diabetes remains uncontrolled previous A1c is 8.7. She is maintaining on Victoza. Continue current dosage of her toes at this time. Obtain A1c and his metabolic panel. Patient will schedule diabetic eye exam independently. Follow-up pending lab work.      Relevant Orders   Basic Metabolic Panel (BMET)   Hemoglobin A1c

## 2014-11-10 ENCOUNTER — Encounter: Payer: Self-pay | Admitting: Family

## 2014-11-10 ENCOUNTER — Other Ambulatory Visit: Payer: Self-pay | Admitting: Family

## 2014-11-10 MED ORDER — SITAGLIPTIN PHOSPHATE 100 MG PO TABS: 100.0000 mg | ORAL_TABLET | Freq: Every day | ORAL | Status: DC

## 2014-11-11 ENCOUNTER — Other Ambulatory Visit: Payer: Self-pay

## 2014-11-11 ENCOUNTER — Encounter: Payer: Self-pay | Admitting: Family

## 2014-11-11 MED ORDER — ONETOUCH LANCETS MISC: Status: AC

## 2014-11-11 MED ORDER — GLUCOSE BLOOD VI STRP: ORAL_STRIP | Status: DC

## 2014-11-13 ENCOUNTER — Encounter: Payer: Self-pay | Admitting: Internal Medicine

## 2014-11-13 DIAGNOSIS — E1365 Other specified diabetes mellitus with hyperglycemia: Secondary | ICD-10-CM

## 2014-11-13 DIAGNOSIS — IMO0001 Reserved for inherently not codable concepts without codable children: Secondary | ICD-10-CM

## 2014-11-13 DIAGNOSIS — IMO0002 Reserved for concepts with insufficient information to code with codable children: Secondary | ICD-10-CM

## 2014-11-13 DIAGNOSIS — E1165 Type 2 diabetes mellitus with hyperglycemia: Principal | ICD-10-CM

## 2014-11-15 ENCOUNTER — Telehealth: Payer: Self-pay | Admitting: Internal Medicine

## 2014-11-15 NOTE — Telephone Encounter (Signed)
LVM for pt to call back as soon as possible.   

## 2014-11-15 NOTE — Telephone Encounter (Signed)
Patient would like to talk with you regarding her diabetes numbers

## 2014-11-16 NOTE — Telephone Encounter (Signed)
Dr. Margaretmary BayleyPreston Clark, [address] [place] [id number] Phone: [phone]   This is the endocrinologist that the pt is requesting.

## 2014-11-16 NOTE — Addendum Note (Signed)
Addended by: Tonye BecketAIRRIKIER, Helia Haese M on: 11/16/2014 02:13 PM   Modules accepted: Orders

## 2014-11-16 NOTE — Telephone Encounter (Signed)
Pt had her question answered via mychart message.

## 2014-11-17 ENCOUNTER — Telehealth: Payer: Self-pay | Admitting: *Deleted

## 2014-11-17 NOTE — Telephone Encounter (Signed)
Not of the endocrinologists here have seen the patient. I believe her diabetes is managed by PCP. She will need to contact them.

## 2014-11-17 NOTE — Telephone Encounter (Signed)
Patient called today, her sugars have been running high 7/20- 6:47-341 7/20 11:00 315 7/19 6:32 am 312 7/19 8:12 pm 234 7/18 6:44 am- 243  Patient is taking Victoza and Januvia 100 mg  CB# [id number]

## 2014-11-17 NOTE — Telephone Encounter (Signed)
Called pt and advised her that the physicians here cannot advise her until she is seen. Advised her per Dr Charlean SanfilippoGherghe's message that she needs to contact her PCP. She stated she has and they advised her to keep taking her medication until she is seen by the Endocrinologist. Pt stated her sugars go up when she eats. Advised pt to watch her carb intake, eat plenty of fresh vegetables and fruit; and plenty of water, which will help flush those sugars out. Pt voiced understanding.

## 2014-11-22 ENCOUNTER — Other Ambulatory Visit: Payer: Self-pay | Admitting: Internal Medicine

## 2014-12-14 LAB — HM DIABETES EYE EXAM

## 2014-12-23 ENCOUNTER — Other Ambulatory Visit: Payer: Self-pay | Admitting: Internal Medicine

## 2014-12-24 ENCOUNTER — Ambulatory Visit (INDEPENDENT_AMBULATORY_CARE_PROVIDER_SITE_OTHER): Payer: PRIVATE HEALTH INSURANCE | Admitting: Endocrinology

## 2014-12-24 ENCOUNTER — Encounter: Payer: Self-pay | Admitting: Endocrinology

## 2014-12-24 VITALS — BP 130/70 | HR 70 | Temp 97.7°F | Resp 16 | Ht 65.5 in | Wt 180.6 lb

## 2014-12-24 DIAGNOSIS — R5383 Other fatigue: Secondary | ICD-10-CM | POA: Diagnosis not present

## 2014-12-24 DIAGNOSIS — E1165 Type 2 diabetes mellitus with hyperglycemia: Secondary | ICD-10-CM

## 2014-12-24 DIAGNOSIS — IMO0001 Reserved for inherently not codable concepts without codable children: Secondary | ICD-10-CM

## 2014-12-24 MED ORDER — METFORMIN HCL 500 MG PO TABS: ORAL_TABLET | ORAL | Status: DC

## 2014-12-24 NOTE — Progress Notes (Signed)
Patient ID: Terry Hull, female   DOB: [birth year], 59 y.o.   MRN: [id number]           Reason for Appointment: Consultation for Type 2 Diabetes  Referring physician:   History of Present Illness:          Date of diagnosis of type 2 diabetes mellitus:   2013       Background history:   Patient had been complaining of fatigue at the time of diagnosis and lab glucose indicated her level was 453 She was initially treated with metformin but she said that since this made her sleepy she did not continue this Subsequently he has been on the variety of medications including Januvia which she is still taking She thinks she had fairly good control with Actos 45 mg but was stopped because of weight gain in 11/2013 At that time she was started on Victoza and this has been continued However her A1c has continued to increase since last year  Recent history:   She has been taking Januvia and Victoza 1.8 mg daily Her blood sugars were significantly high with her PCP on the last visit and lab glucose was over 307/16  Current blood sugar patterns and problems identified:   She did bring her monitor but is checking blood sugars usually once a day, more in the morning  She does not check readings after meals very often but it only occasionally has high readings in the afternoon or evening  Her fasting readings are relatively higher than in the afternoon usually     She has not been able to exercise and she thinks this is because of fatigue  Does not follow any meal plan  Oral hypoglycemic drugs the patient is taking are: Januvia      Side effects from medications have been:Weight gain form Actos, Metformin sleepy, tired  Compliance with the medical regimen: Fair  Glucose monitoring:  done usually 1-2 times a day         Glucometer: One Touch.      Blood Glucose readings by download:  Mean values apply above for all meters except median for One Touch  PRE-MEAL Fasting Lunch Dinner  PCS   Overall  Glucose range:  130-206   105-146   162-239   143-233   104-239   Mean/median:  177      168    Self-care: The diet that the patient has been following is: None Meal times: Breakfast: Lunch: Dinner:   Typical meal intake: Breakfast is such as this could, lunch is a solid, dinner is a sandwich and has snacks with food.  Avoiding drinks with sugar and her soft drinks are diet               Dietician visit, most recent: never               Exercise: Not doing recently because of fatigue    Weight history:145-201  Wt Readings from Last 3 Encounters:  12/24/14 180 lb 9.6 oz (81.92 kg)  11/09/14 178 lb 12.8 oz (81.103 kg)  12/01/13 200 lb (90.719 kg)    Glycemic control:   Lab Results  Component Value Date   HGBA1C 9.1* 11/09/2014   HGBA1C 8.7* 12/01/2013   HGBA1C 6.9* 10/24/2012   Lab Results  Component Value Date   MICROALBUR 1.6 12/01/2013   LDLCALC  02/05/2010    54 (NOTE)  Total Cholesterol/HDL Ratio:CHD Risk  Coronary Heart Disease Risk Table                                       Men       Women         1/2 Average Risk              3.4        3.3             Average Risk              5.0         4.4         2 X Average Risk              9.6        7.1         3 X Average Risk             23.4       11.0 Use the calculated Patient Ratio above and the CHD Risk table  to determine the patient's CHD Risk. ATP III Classification (LDL):      < 100         mg/dL         Optimal     119 - 129     mg/dL         Near or Above Optimal     130 - 159     mg/dL         Borderline High     160 - 189     mg/dL         High      > 147        mg/dL         Very High    CREATININE 0.94 11/09/2014         Medication List       This list is accurate as of: 12/24/14 11:59 PM.  Always use your most recent med list.               albuterol 108 (90 BASE) MCG/ACT inhaler  Commonly known as:  PROVENTIL HFA;VENTOLIN HFA  Inhale 2 puffs into the lungs every 6  (six) hours as needed for wheezing or shortness of breath.     ALPRAZolam 0.5 MG tablet  Commonly known as:  XANAX  Take 1 tablet (0.5 mg total) by mouth 3 (three) times daily as needed for sleep.     amLODipine 5 MG tablet  Commonly known as:  NORVASC  Take 1 tablet (5 mg total) by mouth daily.     aspirin 81 MG tablet  Take 81 mg by mouth daily.     carvedilol 6.25 MG tablet  Commonly known as:  COREG  TAKE 1 TABLET BY MOUTH TWICE DAILY WITH MEALS     citalopram 20 MG tablet  Commonly known as:  CELEXA  TAKE 1 TABLET BY MOUTH DAILY     diclofenac 75 MG EC tablet  Commonly known as:  VOLTAREN  Take 1 tablet (75 mg total) by mouth 2 (two) times daily as needed for mild pain or moderate pain.     glucose blood test strip  Commonly known as:  ONE TOUCH TEST STRIPS  Use check blood sugar before breakfast and bedtime     hydrochlorothiazide 12.5 MG capsule  Commonly  known as:  MICROZIDE  TAKE 1 CAPSULE BY MOUTH DAILY     Insulin Pen Needle 32G X 4 MM Misc  Commonly known as:  NOVOFINE PLUS  1 pen by Does not apply route daily.     lisinopril 40 MG tablet  Commonly known as:  PRINIVIL,ZESTRIL  TAKE 1 TABLET BY MOUTH EVERY DAY     metFORMIN 500 MG tablet  Commonly known as:  GLUCOPHAGE  3 daily as directed     ONE TOUCH LANCETS Misc  Use to help check blood sugar twice a day     pravastatin 20 MG tablet  Commonly known as:  PRAVACHOL  TAKE 1 TABLET BY MOUTH EVERY DAY     sitaGLIPtin 100 MG tablet  Commonly known as:  JANUVIA  Take 1 tablet (100 mg total) by mouth daily.     VICTOZA 18 MG/3ML Sopn  Generic drug:  Liraglutide  INJECT 1.8 MG UNDER THE SKIN DAILY        Allergies: No Known Allergies  Past Medical History  Diagnosis Date  . DIABETES MELLITUS, TYPE II     diet controlled  . HYPERTRIGLYCERIDEMIA   . HYPERTENSION   . ALLERGIC RHINITIS   . ARTHRITIS   . SLEEP APNEA   . Abnormal electrocardiogram     consistent with LVH hypertrophy     Past Surgical History  Procedure Laterality Date  . Nasal polyp surgery    . Abdominal hysterectomy    . Hand tendon surgery      Family History  Problem Relation Age of Onset  . Cancer Mother     Ovarian Cancer  . Hyperlipidemia Mother   . Hypertension Mother   . Diabetes Other     1st degree relative    Social History:  reports that she quit smoking about 1 years ago. She does not have any smokeless tobacco history on file. She reports that she drinks alcohol. She reports that she does not use illicit drugs.    Review of Systems    Lipid history: On pravastatin for 6-7 years, has consistently high triglycerides, no recent labs available    Lab Results  Component Value Date   CHOL 223* 12/01/2013   HDL 54.70 12/01/2013   LDLCALC  02/05/2010    54 (NOTE)  Total Cholesterol/HDL Ratio:CHD Risk                       Coronary Heart Disease Risk Table                                       Men       Women         1/2 Average Risk              3.4        3.3             Average Risk              5.0         4.4         2 X Average Risk              9.6        7.1         3 X Average Risk  23.4       11.0 Use the calculated Patient Ratio above and the CHD Risk table  to determine the patient's CHD Risk. ATP III Classification (LDL):      < 100         mg/dL         Optimal     161 - 129     mg/dL         Near or Above Optimal     130 - 159     mg/dL         Borderline High     160 - 189     mg/dL         High      > 096        mg/dL         Very High    LDLDIRECT 108.5 12/01/2013   TRIG * 12/01/2013    400.0 Triglyceride is over 400; calculations on Lipids are invalid.   CHOLHDL 4 12/01/2013           Constitutional: no recent weight gain/loss For about 1 month has had unusual fatigue   Eyes: no history of blurred vision.  Most recent eye exam was   ENT: no nasal congestion, difficulty swallowing, no hoarseness   Cardiovascular: no chest pain or tightness on  exertion.  No leg swelling.  Hypertension: Treated with medications for 6-7 years  Respiratory: no cough/shortness of breath  Gastrointestinal: no constipation or abdominal pain  Musculoskeletal: no muscle pain or cramps, does have some pain in her hands   Urological:   No frequency of urination or  nocturia  Skin: no rash or infections  Neurological: no headaches.  Has no numbness, burning, pains but has some tingling in feet which is not significant    Psychiatric: no symptoms of depression, controlled with Celexa  Endocrine: She has been complaining of more tiredness lately, sometimes has cold intolerance  No history of thyroid disease  Lab Results  Component Value Date   TSH 0.74 12/01/2013      LABS:  Documentation on 12/21/2014  Component Date Value Ref Range Status  . HM Diabetic Eye Exam 12/14/2014 Retinopathy* No Retinopathy Final    Physical Examination:  BP 130/70 mmHg  Pulse 70  Temp(Src) 97.7 F (36.5 C)  Resp 16  Ht 5' 5.5" (1.664 m)  Wt 180 lb 9.6 oz (81.92 kg)  BMI 29.59 kg/m2  SpO2 95%  GENERAL:         Patient has generalized obesity.   HEENT:         Eye exam shows normal external appearance. Fundus exam shows no retinopathy. Oral exam shows normal mucosa .  NECK:   There is no lymphadenopathy Thyroid is not enlarged and no nodules felt.  Carotids are normal to palpation and no bruit heard LUNGS:         Chest is symmetrical. Lungs are clear to auscultation.Marland Kitchen   HEART:         Heart sounds:  S1 and S2 are normal. No murmurs or clicks heard., no S3 or S4.   ABDOMEN:   There is no distention present. Liver and spleen are not palpable. No other mass or tenderness present.   NEUROLOGICAL:   Vibration sense is minimally reduced in distal first toes. Ankle jerks are normal bilaterally.          Diabetic foot exam shows normal monofilament sensation in the toes and plantar surfaces,  no skin lesions or ulcers on the feet and normal pedal  pulses MUSCULOSKELETAL:  There is no swelling or deformity of the peripheral joints. Spine is normal to inspection.   EXTREMITIES:     There is no edema. No skin lesions present.Marland Kitchen SKIN:       No rash or lesions of concern.        ASSESSMENT:  Diabetes type 2, uncontrolled with recent A1c 9.1%    She is on Januvia and Victoza and currently tends to have relatively high fasting readings and occasionally high post new readings based on her diet She has had minimal diabetes education is not following a consistent meal plan, can reduce fat intake She has not been motivated to exercise much overall and recently not able to do this because of  fatigue  Explained to patient that she is having more high fasting readings because of increased glucose production by the liver and Ideal candidate for metformin if she can tolerate this; reportedly had drowsiness with this before but this is difficult to explain Also discussed that Januvia is not needed when she is already taking a more potent GLP-1 drug like Victoza She does have insulin resistance as she was responding to Actos but apparently had weight gain with this  Complications: None evident  HYPERLIPIDEMIA: Has had only fair control and persistently high triglycerides, partly from insulin resistance and also poor diabetes control and obesity.  LDL is not at target and she is taking only 20 mg pravastatin  HYPERTENSION: Appears well controlled with multiple drugs  FATIGUE: Unclear etiology, blood sugars are not high enough to cause this.  Will need to have her follow-up with PCP; will recheck TSH on next visit  PLAN:   Trial of metformin at night.  Discussed that in case she has drowsiness with this she will be better taking this in the evenings and can start with 500 mg and gradually titrate up to a maximum dose of 1500 mg.  If this is not tolerated will switch her to Invokana this may be desirable anyway to help her with some weight loss  also  Start exercise when able to  Consultation with dietitian  More blood sugars after meals  Recheck fasting lipids when blood sugars are controlled  Patient Instructions  Check blood sugars on waking up .Marland Kitchen2-3  .Marland Kitchen times a week Also check blood sugars about 2 hours after a meal and do this after different meals by rotation  Recommended blood sugar levels on waking up is 90-130 and about 2 hours after meal is 140-180 Please bring blood sugar monitor to each visit.  Stop [employer] taking Metformin 500 mg, 1 tablet at nite for 7 days. Occasionally this may initially cause loose stools or nausea. If  tolerating well after 7 days add a second Metformin tablet (500 mg) Continue adding another tablet after 7 days to max 3 daily     Javayah Magaw 12/26/2014, 3:09 PM   Note: This office note was prepared with Insurance underwriter. Any transcriptional errors that result from this process are unintentional.

## 2014-12-24 NOTE — Patient Instructions (Addendum)
Check blood sugars on waking up .Marland Kitchen2-3  .Marland Kitchen times a week Also check blood sugars about 2 hours after a meal and do this after different meals by rotation  Recommended blood sugar levels on waking up is 90-130 and about 2 hours after meal is 140-180 Please bring blood sugar monitor to each visit.  Stop [employer] taking Metformin 500 mg, 1 tablet at nite for 7 days. Occasionally this may initially cause loose stools or nausea. If  tolerating well after 7 days add a second Metformin tablet (500 mg) Continue adding another tablet after 7 days to max 3 daily

## 2014-12-29 ENCOUNTER — Telehealth: Payer: Self-pay | Admitting: *Deleted

## 2014-12-29 MED ORDER — LISINOPRIL 40 MG PO TABS: 40.0000 mg | ORAL_TABLET | Freq: Every day | ORAL | Status: DC

## 2014-12-29 MED ORDER — PRAVASTATIN SODIUM 20 MG PO TABS: 20.0000 mg | ORAL_TABLET | Freq: Every day | ORAL | Status: DC

## 2014-12-29 NOTE — Telephone Encounter (Signed)
Receive call pt states pharmacy have not receive refills back on her pravastatin & lisinopril. Verified pharmacy inform pt will [facility] to [facility]...Raechel Chute

## 2015-01-13 ENCOUNTER — Other Ambulatory Visit: Payer: Self-pay | Admitting: *Deleted

## 2015-01-13 MED ORDER — ALPRAZOLAM 0.5 MG PO TABS: 0.5000 mg | ORAL_TABLET | Freq: Three times a day (TID) | ORAL | Status: DC | PRN

## 2015-01-13 NOTE — Telephone Encounter (Signed)
Left msg on triage stating pharmacy has been trying to get refill on her alprazolam since Monday still haven't receive response. Per chart no refill has came through. Is it ok to renew alprazolam.../lmb

## 2015-01-13 NOTE — Telephone Encounter (Signed)
[facility]ed [facility]ill into [facility] spoke with stephanie/pharmist gave md approval. Notified pt rx has been call to [facility]...Terry Hull

## 2015-01-13 NOTE — Telephone Encounter (Signed)
Ok to phone in refill as requested thanks

## 2015-01-18 ENCOUNTER — Other Ambulatory Visit: Payer: PRIVATE HEALTH INSURANCE

## 2015-01-20 LAB — HM MAMMOGRAPHY

## 2015-01-21 ENCOUNTER — Encounter: Payer: Self-pay | Admitting: Endocrinology

## 2015-01-21 ENCOUNTER — Ambulatory Visit: Payer: PRIVATE HEALTH INSURANCE | Admitting: Endocrinology

## 2015-01-21 ENCOUNTER — Encounter: Payer: PRIVATE HEALTH INSURANCE | Attending: Endocrinology | Admitting: Dietician

## 2015-01-21 ENCOUNTER — Ambulatory Visit (INDEPENDENT_AMBULATORY_CARE_PROVIDER_SITE_OTHER): Payer: PRIVATE HEALTH INSURANCE | Admitting: Endocrinology

## 2015-01-21 VITALS — Ht 67.0 in | Wt 177.0 lb

## 2015-01-21 DIAGNOSIS — Z794 Long term (current) use of insulin: Secondary | ICD-10-CM | POA: Diagnosis not present

## 2015-01-21 DIAGNOSIS — Z713 Dietary counseling and surveillance: Secondary | ICD-10-CM | POA: Insufficient documentation

## 2015-01-21 DIAGNOSIS — IMO0002 Reserved for concepts with insufficient information to code with codable children: Secondary | ICD-10-CM

## 2015-01-21 DIAGNOSIS — E669 Obesity, unspecified: Secondary | ICD-10-CM | POA: Diagnosis not present

## 2015-01-21 DIAGNOSIS — Z6827 Body mass index (BMI) 27.0-27.9, adult: Secondary | ICD-10-CM | POA: Diagnosis not present

## 2015-01-21 DIAGNOSIS — IMO0001 Reserved for inherently not codable concepts without codable children: Secondary | ICD-10-CM

## 2015-01-21 DIAGNOSIS — E1165 Type 2 diabetes mellitus with hyperglycemia: Secondary | ICD-10-CM

## 2015-01-21 DIAGNOSIS — R5383 Other fatigue: Secondary | ICD-10-CM | POA: Diagnosis not present

## 2015-01-21 LAB — BASIC METABOLIC PANEL
BUN: 19 mg/dL (ref 6–23)
CALCIUM: 10.2 mg/dL (ref 8.4–10.5)
CO2: 31 mEq/L (ref 19–32)
CREATININE: 0.99 mg/dL (ref 0.40–1.20)
Chloride: 100 mEq/L (ref 96–112)
GFR: 73.58 mL/min (ref >60.00)
Glucose, Bld: 131 mg/dL — ABNORMAL HIGH (ref 70–99)
Potassium: 4 mEq/L (ref 3.5–5.1)
Sodium: 140 mEq/L (ref 135–145)

## 2015-01-21 LAB — TSH: TSH: 0.58 u[IU]/mL (ref 0.35–4.50)

## 2015-01-21 MED ORDER — METFORMIN HCL ER 500 MG PO TB24: 2000.0000 mg | ORAL_TABLET | Freq: Every day | ORAL | Status: DC

## 2015-01-21 NOTE — Progress Notes (Signed)
Patient ID: Terry Hull, female   DOB: 1955/06/17, 59 y.o.   MRN: [id number]           Reason for Appointment: Follow-up for Type 2 Diabetes  Referring physician:   History of Present Illness:          Date of diagnosis of type 2 diabetes mellitus:   2013       Background history:   Patient had been complaining of fatigue at the time of diagnosis and lab glucose indicated her level was 453 She was initially treated with metformin but she said that since this made her sleepy she did not continue this Subsequently he has been on the variety of medications including Januvia which she is still taking She thinks she had fairly good control with Actos 45 mg but was stopped because of weight gain in 11/2013 At that time she was started on Victoza and this has been continued However her A1c has continued to increase since 2015.  Recent history:   Her blood sugars were significantly high prior to her initial consultation On her initial visit she was started on metformin and Januvia discontinued.  Victoza was continued Although she had previously thought that metformin was making her sleepy and tired she was able to tolerate this and is taking 2 tablets in the morning and 1 at dinnertime She has mild audible nausea and occasional diarrhea with this  Current blood sugar patterns and problems identified:   She has checked her blood sugars at various times although more in the morning; blood sugars are significantly better compared to her last visit when her average was 168  Her fasting readings are significantly better especially in the last few days  She has not been able to exercise much although is starting to walk a little  Weight is slightly better She is going to see the dietitian today   Hypoglycemic drugs the patient is taking are: Metformin 1500 mg a day, Victoza 1.8 mg daily      Side effects from medications have been:Weight gain form Actos   Compliance with the medical  regimen: Fair  Glucose monitoring:  done usually 1-2 times a day         Glucometer: One Touch.      Blood Glucose readings by download:  Mean values apply above for all meters except median for One Touch  PRE-MEAL Fasting Lunch Dinner  7-11 PM  Overall  Glucose range:  126-184    120-165   132-268    Mean/median:  148      139    Self-care: The diet that the patient has been following is: None Typical meal intake: Breakfast is such as this could, lunch is a salad, dinner is a sandwich and has snacks with food.  Avoiding drinks with sugar and her soft drinks are diet               Dietician visit, most recent: 01/21/15               Exercise: Not doing much recently because of fatigue    Weight history: Previous range 145-201  Wt Readings from Last 3 Encounters:  01/21/15 177 lb (80.287 kg)  01/21/15 177 lb 9.6 oz (80.559 kg)  12/24/14 180 lb 9.6 oz (81.92 kg)    Glycemic control:   Lab Results  Component Value Date   HGBA1C 9.1* 11/09/2014   HGBA1C 8.7* 12/01/2013   HGBA1C 6.9* 10/24/2012   Lab Results  Component Value Date   MICROALBUR 1.6 12/01/2013   LDLCALC  02/05/2010    54 (NOTE)  Total Cholesterol/HDL Ratio:CHD Risk                       Coronary Heart Disease Risk Table                                       Men       Women         1/2 Average Risk              3.4        3.3             Average Risk              5.0         4.4         2 X Average Risk              9.6        7.1         3 X Average Risk             23.4       11.0 Use the calculated Patient Ratio above and the CHD Risk table  to determine the patient's CHD Risk. ATP III Classification (LDL):      < 100         mg/dL         Optimal     098 - 129     mg/dL         Near or Above Optimal     130 - 159     mg/dL         Borderline High     160 - 189     mg/dL         High      > 119        mg/dL         Very High    CREATININE 0.99 01/21/2015         Medication List       This list is accurate  as of: 01/21/15 11:59 PM.  Always use your most recent med list.               albuterol 108 (90 BASE) MCG/ACT inhaler  Commonly known as:  PROVENTIL HFA;VENTOLIN HFA  Inhale 2 puffs into the lungs every 6 (six) hours as needed for wheezing or shortness of breath.     ALPRAZolam 0.5 MG tablet  Commonly known as:  XANAX  Take 1 tablet (0.5 mg total) by mouth 3 (three) times daily as needed for anxiety or sleep.     amLODipine 5 MG tablet  Commonly known as:  NORVASC  Take 1 tablet (5 mg total) by mouth daily.     carvedilol 6.25 MG tablet  Commonly known as:  COREG  TAKE 1 TABLET BY MOUTH TWICE DAILY WITH MEALS     citalopram 20 MG tablet  Commonly known as:  CELEXA  TAKE 1 TABLET BY MOUTH DAILY     diclofenac 75 MG EC tablet  Commonly known as:  VOLTAREN  Take 1 tablet (75 mg total) by mouth 2 (two) times daily as needed for mild pain or moderate pain.  glucose blood test strip  Commonly known as:  ONE TOUCH TEST STRIPS  Use check blood sugar before breakfast and bedtime     hydrochlorothiazide 12.5 MG capsule  Commonly known as:  MICROZIDE  TAKE 1 CAPSULE BY MOUTH DAILY     Insulin Pen Needle 32G X 4 MM Misc  Commonly known as:  NOVOFINE PLUS  1 pen by Does not apply route daily.     lisinopril 40 MG tablet  Commonly known as:  PRINIVIL,ZESTRIL  Take 1 tablet (40 mg total) by mouth daily.     metFORMIN 500 MG 24 hr tablet  Commonly known as:  GLUCOPHAGE-XR  Take 4 tablets (2,000 mg total) by mouth daily with supper.     ONE TOUCH LANCETS Misc  Use to help check blood sugar twice a day     pravastatin 20 MG tablet  Commonly known as:  PRAVACHOL  Take 1 tablet (20 mg total) by mouth daily.     VICTOZA 18 MG/3ML Sopn  Generic drug:  Liraglutide  INJECT 1.8 MG UNDER THE SKIN DAILY        Allergies: No Known Allergies  Past Medical History  Diagnosis Date  . DIABETES MELLITUS, TYPE II     diet controlled  . HYPERTRIGLYCERIDEMIA   . HYPERTENSION     . ALLERGIC RHINITIS   . ARTHRITIS   . SLEEP APNEA   . Abnormal electrocardiogram     consistent with LVH hypertrophy    Past Surgical History  Procedure Laterality Date  . Nasal polyp surgery    . Abdominal hysterectomy    . Hand tendon surgery      Family History  Problem Relation Age of Onset  . Cancer Mother     Ovarian Cancer  . Hyperlipidemia Mother   . Hypertension Mother   . Diabetes Other     1st degree relative    Social History:  reports that she quit smoking about 2 years ago. She does not have any smokeless tobacco history on file. She reports that she drinks alcohol. She reports that she does not use illicit drugs.    Review of Systems    Lipid history: On pravastatin for 6-7 years, has consistently high triglycerides, no recent labs available    Lab Results  Component Value Date   CHOL 223* 12/01/2013   HDL 54.70 12/01/2013   LDLCALC  02/05/2010    54 (NOTE)  Total Cholesterol/HDL Ratio:CHD Risk                       Coronary Heart Disease Risk Table                                       Men       Women         1/2 Average Risk              3.4        3.3             Average Risk              5.0         4.4         2 X Average Risk              9.6        7.1  3 X Average Risk             23.4       11.0 Use the calculated Patient Ratio above and the CHD Risk table  to determine the patient's CHD Risk. ATP III Classification (LDL):      < 100         mg/dL         Optimal     782 - 129     mg/dL         Near or Above Optimal     130 - 159     mg/dL         Borderline High     160 - 189     mg/dL         High      > 956        mg/dL         Very High    LDLDIRECT 108.5 12/01/2013   TRIG * 12/01/2013    400.0 Triglyceride is over 400; calculations on Lipids are invalid.   CHOLHDL 4 12/01/2013           Constitutional:   For about 1 month has had unusual fatigue, has not discussed with PCP   Eyes:  Most recent eye exam was 7/16  Hypertension:  Treated with medications for 6-7 years  Endocrine: She has been complaining of more tiredness lately, sometimes has cold intolerance  No history of thyroid disease  Lab Results  Component Value Date   TSH 0.58 01/21/2015      LABS:  Office Visit on 01/21/2015  Component Date Value Ref Range Status  . Sodium 01/21/2015 140  135 - 145 mEq/L Final  . Potassium 01/21/2015 4.0  3.5 - 5.1 mEq/L Final  . Chloride 01/21/2015 100  96 - 112 mEq/L Final  . CO2 01/21/2015 31  19 - 32 mEq/L Final  . Glucose, Bld 01/21/2015 131* 70 - 99 mg/dL Final  . BUN 21/30/8657 19  6 - 23 mg/dL Final  . Creatinine, Ser 01/21/2015 0.99  0.40 - 1.20 mg/dL Final  . Calcium 84/69/6295 10.2  8.4 - 10.5 mg/dL Final  . GFR 28/41/3244 73.58  >60.00 mL/min Final  . Fructosamine 01/21/2015 295* 0 - 285 umol/L Final   Comment: Published reference interval for apparently healthy subjects between age 79 and 61 is 75 - 285 umol/L and in a poorly controlled diabetic population is 228 - 563 umol/L with a mean of 396 umol/L.   Marland Kitchen TSH 01/21/2015 0.58  0.35 - 4.50 uIU/mL Final    Physical Examination:  BP 134/76 mmHg  Pulse 72  Temp(Src) 98.5 F (36.9 C)  Resp 16  Ht 5' 5.5" (1.664 m)  Wt 177 lb 9.6 oz (80.559 kg)  BMI 29.09 kg/m2  SpO2 95%       ASSESSMENT:  Diabetes type 2, uncontrolled with recent A1c 9.1%    She has had improved blood sugar control with adding metformin to her Victoza She has tolerated this very well and has had only mild nausea and loose stools. She tends to have relatively high fasting readings but also recently has not checked enough readings after meals  Since she appears to be benefiting from adding metformin along with a slight weight loss will continue this She may be able to tolerate metformin ER better than regular metformin She will continue her Victoza also using 1.8 mg daily  FATIGUE: Unclear etiology, this is affecting  her ability to exercise.  Will need to have her  follow-up with PCP; will recheck TSH   PLAN:   Trial of metformin ER 2000 mg a day when she finishes regular metformin  More consistent blood sugar monitoring  Patient Instructions  Metformin 1 in am and 2 at dinner  Check blood sugars on waking up .. 3 .. times a week Also check blood sugars about 2 hours after a meal and do this after different meals by rotation  Recommended blood sugar levels on waking up is 90-130 and about 2 hours after meal is 140-180 Please bring blood sugar monitor to each visit.     [facility] 01/23/2015, 6:49 PM   Note: This office note was prepared with Dragon voice recognition system technology. Any transcriptional errors that result from this process are unintentional.  Addendum: Fructosamine mildly increased at 295

## 2015-01-21 NOTE — Patient Instructions (Addendum)
Metformin 1 in am and 2 at dinner  Check blood sugars on waking up .. 3 .. times a week Also check blood sugars about 2 hours after a meal and do this after different meals by rotation  Recommended blood sugar levels on waking up is 90-130 and about 2 hours after meal is 140-180 Please bring blood sugar monitor to each visit.

## 2015-01-21 NOTE — Progress Notes (Signed)
Diabetes Self-Management Education  Visit Type: First/Initial  Appt. Start Time: 1400 Appt. End Time: 1515  01/21/2015  Ms. Terry Hull, identified by name and date of birth, is a 59 y.o. female with a diagnosis of Diabetes: Type 2.   Patient is here alone.  She has had type 2 diabetes for 5-6 years.  Her husband also has type 2 diabetes.  She does the shopping and cooking and seldom eats out.  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  She sits a lot at work but has been walking in the building at times during lunch.  Her weight is 177 lbs today.  She states that about a year ago her weight increased to 200 lbs while on Actose.  She has tried multiple medications and states that most have not worked well.  She like Invokana because she lost weight but had multiple UTI's and it was discontinued.  She has been taking Metformin XR and she states that she is tolerating this and has seen improvements in her blood sugar from the high 180's to 200's to current closer to 130.  She has lost weight from 200 lbs to 177 lbs with the Invokana and decreasing her carbohydrate intake with her meals.  We discussed need to decrease the carbohydrate/calorie content of her snacks, increase her vegetable intake with weekday meals and consider changing to thin bread.  She states that she used to eat a lot of french fries but has stopped.  She stopped smoking 2 years ago.  We also discussed the need to decrease some of her oil intake.  Increase exercise beneficial and other tips to decrease insulin resistance.  ASSESSMENT  Height  (1.702 m), weight 177 lb (80.287 kg). Body mass index is 27.72 kg/(m^2).      Diabetes Self-Management Education - 01/21/15 1737    Visit Information   Visit Type First/Initial   Initial Visit   Diabetes Type Type 2   Are you currently following a meal plan? No   Are you taking your medications as prescribed? Yes   Date Diagnosed 5-6 years ago   Health Coping   How  would you rate your overall health? Fair   Psychosocial Assessment   Patient Belief/Attitude about Diabetes Other (comment)  frustrated   Self-care barriers None   Self-management support Doctor's office;Family   Other persons present Patient   Patient Concerns Glycemic Control;Nutrition/Meal planning   Special Needs None   Preferred Learning Style No preference indicated   Learning Readiness Ready   How often do you need to have someone help you when you read instructions, pamphlets, or other written materials from your doctor or pharmacy? 1 - Never   What is the last grade level you completed in school? bachelors degree   Complications   Last HgB A1C per patient/outside source 9.1 %  11/09/14   How often do you check your blood sugar? 1-2 times/day   Fasting Blood glucose range (mg/dL) 16-109   Postprandial Blood glucose range (mg/dL) 60-454   Number of hypoglycemic episodes per month 0   Number of hyperglycemic episodes per week 0   Have you had a dilated eye exam in the past 12 months? Yes   Have you had a dental exam in the past 12 months? Yes   Are you checking your feet? Yes   How many days per week are you checking your feet? 1   Dietary Intake   Breakfast cheese toast with  fruit, water or OJ OR [place] sausage, mandarine oranges or berries with whipped cream or fried apples   Snack (morning) 4 nutterbutter cookies or peanut butter crackers or fruit   Lunch salad or [place] sandwhich on [employer] with mayo or mustard   Snack (afternoon) Peanut butter cookies or crackers or fruit above   Dinner sandwhich or salad OR weekends grilled meat, veges, starches   Snack (evening) 4 nutterbutter cookies or crackers   Beverage(s) water or diet coke, water, coffee with splenda and cream, OJ occasionally, green tea rarely   Exercise   Exercise Type Light (walking / raking leaves)   How many days per week to you exercise? 3   How many minutes per day do you exercise? 20   Total minutes per week  of exercise 60   Patient Education   Previous Diabetes Education No   Disease state  Definition of diabetes, type 1 and 2, and the diagnosis of diabetes;Factors that contribute to the development of diabetes   Nutrition management  Role of diet in the treatment of diabetes and the relationship between the three main macronutrients and blood glucose level;Carbohydrate counting;Food label reading, portion sizes and measuring food.;Reviewed blood glucose goals for pre and post meals and how to evaluate the patients' food intake on their blood glucose level.;Meal options for control of blood glucose level and chronic complications.   Physical activity and exercise  Role of exercise on diabetes management, blood pressure control and cardiac health.;Helped patient identify appropriate exercises in relation to his/her diabetes, diabetes complications and other health issue.   Monitoring Identified appropriate SMBG and/or A1C goals.;Daily foot exams   Chronic complications Relationship between chronic complications and blood glucose control   Psychosocial adjustment Worked with patient to identify barriers to care and solutions;Role of stress on diabetes;Identified and addressed patients feelings and concerns about diabetes   Personal strategies to promote health Lifestyle issues that need to be addressed for better diabetes care   Individualized Goals (developed by patient)   Nutrition General guidelines for healthy choices and portions discussed;Follow meal plan discussed   Physical Activity Exercise 3-5 times per week;30 minutes per day   Medications take my medication as prescribed   Monitoring  test my blood glucose as discussed   Reducing Risk examine blood glucose patterns      Individualized Plan for Diabetes Self-Management Training:   Learning Objective:  Patient will have a greater understanding of diabetes self-management. Patient education plan is to attend individual and/or group sessions  per assessed needs and concerns.   Plan:   Patient Instructions  Find something that you like to do most days of the week. Consider a dance class. Continue increased intake of the non starchy vegetables. Small amounts of protein at each meal.  Beans are a great source of fiber and protein. Add more vegetables during the week.   Try whole wheat flat bread for sandwiches. Be mindful of the amount of oil/butter that you use. Limit carbohydrates to 15 grams for a snack if you are hungry.  Have with a small amount of protein.    Expected Outcomes:   Patient demonstrated interest and positive change expected.  Education material provided: Living Well with Diabetes, Meal plan card and Snack sheet, Weight management resource sheet  If problems or questions, patient to contact team via:  Phone and Email  Future DSME appointment:  2 months

## 2015-01-21 NOTE — Patient Instructions (Signed)
Find something that you like to do most days of the week. Consider a dance class. Continue increased intake of the non starchy vegetables. Small amounts of protein at each meal.  Beans are a great source of fiber and protein. Add more vegetables during the week.   Try whole wheat flat bread for sandwiches. Be mindful of the amount of oil/butter that you use. Limit carbohydrates to 15 grams for a snack if you are hungry.  Have with a small amount of protein.

## 2015-01-22 LAB — FRUCTOSAMINE: Fructosamine: 295 umol/L — ABNORMAL HIGH (ref 0–285)

## 2015-01-26 ENCOUNTER — Other Ambulatory Visit: Payer: Self-pay | Admitting: Internal Medicine

## 2015-02-02 ENCOUNTER — Encounter: Payer: Self-pay | Admitting: Internal Medicine

## 2015-03-07 ENCOUNTER — Encounter: Payer: Self-pay | Admitting: Internal Medicine

## 2015-03-07 ENCOUNTER — Other Ambulatory Visit (INDEPENDENT_AMBULATORY_CARE_PROVIDER_SITE_OTHER): Payer: PRIVATE HEALTH INSURANCE

## 2015-03-07 ENCOUNTER — Ambulatory Visit (INDEPENDENT_AMBULATORY_CARE_PROVIDER_SITE_OTHER): Payer: PRIVATE HEALTH INSURANCE | Admitting: Internal Medicine

## 2015-03-07 VITALS — BP 138/82 | HR 72 | Temp 98.5°F | Ht 65.5 in | Wt 174.2 lb

## 2015-03-07 DIAGNOSIS — J301 Allergic rhinitis due to pollen: Secondary | ICD-10-CM | POA: Diagnosis not present

## 2015-03-07 DIAGNOSIS — Z0000 Encounter for general adult medical examination without abnormal findings: Secondary | ICD-10-CM | POA: Diagnosis not present

## 2015-03-07 DIAGNOSIS — E1165 Type 2 diabetes mellitus with hyperglycemia: Secondary | ICD-10-CM | POA: Diagnosis not present

## 2015-03-07 DIAGNOSIS — Z23 Encounter for immunization: Secondary | ICD-10-CM | POA: Diagnosis not present

## 2015-03-07 DIAGNOSIS — I10 Essential (primary) hypertension: Secondary | ICD-10-CM

## 2015-03-07 LAB — CBC WITH DIFFERENTIAL/PLATELET
BASOS PCT: 0.4 % (ref 0.0–3.0)
Basophils Absolute: 0 10*3/uL (ref 0.0–0.1)
EOS PCT: 11.2 % — AB (ref 0.0–5.0)
Eosinophils Absolute: 0.9 10*3/uL — ABNORMAL HIGH (ref 0.0–0.7)
HEMATOCRIT: 40.4 % (ref 36.0–46.0)
Hemoglobin: 13.3 g/dL (ref 12.0–15.0)
LYMPHS ABS: 2.1 10*3/uL (ref 0.7–4.0)
Lymphocytes Relative: 25.9 % (ref 12.0–46.0)
MCHC: 33 g/dL (ref 30.0–36.0)
MCV: 92.1 fl (ref 78.0–100.0)
MONOS PCT: 10.3 % (ref 3.0–12.0)
Monocytes Absolute: 0.9 10*3/uL (ref 0.1–1.0)
NEUTROS ABS: 4.3 10*3/uL (ref 1.4–7.7)
NEUTROS PCT: 52.2 % (ref 43.0–77.0)
PLATELETS: 307 10*3/uL (ref 150.0–400.0)
RBC: 4.38 Mil/uL (ref 3.87–5.11)
RDW: 14 % (ref 11.5–15.5)
WBC: 8.2 10*3/uL (ref 4.0–10.5)

## 2015-03-07 LAB — HEPATIC FUNCTION PANEL
ALBUMIN: 4.4 g/dL (ref 3.5–5.2)
ALT: 25 U/L (ref 0–35)
AST: 17 U/L (ref 0–37)
Alkaline Phosphatase: 55 U/L (ref 39–117)
Bilirubin, Direct: 0.1 mg/dL (ref 0.0–0.3)
TOTAL PROTEIN: 7.3 g/dL (ref 6.0–8.3)
Total Bilirubin: 0.5 mg/dL (ref 0.2–1.2)

## 2015-03-07 LAB — MICROALBUMIN / CREATININE URINE RATIO
CREATININE, U: 209.4 mg/dL
MICROALB/CREAT RATIO: 5.7 mg/g (ref 0.0–30.0)
Microalb, Ur: 11.9 mg/dL — ABNORMAL HIGH (ref 0.0–1.9)

## 2015-03-07 LAB — BASIC METABOLIC PANEL
BUN: 16 mg/dL (ref 6–23)
CO2: 32 meq/L (ref 19–32)
Calcium: 10.1 mg/dL (ref 8.4–10.5)
Chloride: 100 mEq/L (ref 96–112)
Creatinine, Ser: 0.83 mg/dL (ref 0.40–1.20)
GFR: 90.15 mL/min (ref >60.00)
GLUCOSE: 85 mg/dL (ref 70–99)
Potassium: 4.3 mEq/L (ref 3.5–5.1)
SODIUM: 141 meq/L (ref 135–145)

## 2015-03-07 LAB — LIPID PANEL
CHOLESTEROL: 159 mg/dL (ref 0–200)
HDL: 49.4 mg/dL (ref >39.00)
LDL Cholesterol: 82 mg/dL (ref 0–99)
NonHDL: 109.63
Total CHOL/HDL Ratio: 3
Triglycerides: 139 mg/dL (ref 0.0–149.0)
VLDL: 27.8 mg/dL (ref 0.0–40.0)

## 2015-03-07 LAB — HEMOGLOBIN A1C: HEMOGLOBIN A1C: 6.7 % — AB (ref 4.6–6.5)

## 2015-03-07 MED ORDER — FLUTICASONE PROPIONATE 50 MCG/ACT NA SUSP: 1.0000 | Freq: Every day | NASAL | Status: DC

## 2015-03-07 MED ORDER — EXENATIDE 10 MCG/0.04ML ~~LOC~~ SOPN
10.0000 ug | PEN_INJECTOR | Freq: Two times a day (BID) | SUBCUTANEOUS | Status: DC
Start: 2015-03-07 — End: 2015-06-08

## 2015-03-07 MED ORDER — HYDROCHLOROTHIAZIDE 12.5 MG PO CAPS: 12.5000 mg | ORAL_CAPSULE | Freq: Every day | ORAL | Status: DC

## 2015-03-07 NOTE — Assessment & Plan Note (Signed)
Diet controlled until 2013 - rx Actos due to a1c >10 then briefly lost to follow-up check a1c, Cr and microalb now - also lipids  On ACEI and statin Reviewed weight gain and influence of same on glycemic control 11/2014: stopped actos and started victoza -  Change victoza to [place] BID now for formulary pref Has been evaluated by endocrinologist in the past year, continue follow-up there as planned  Lab Results  Component Value Date   HGBA1C  Value: 6.5 (NOTE)                                                           02/05/2010   Lab Results  Component Value Date   HGBA1C 9.1* 11/09/2014

## 2015-03-07 NOTE — Assessment & Plan Note (Signed)
Seasonal flares in the past 6 weeks with postnasal drainage and nocturnal cough. Lungs clear and exam. Recommend nasal steroid and over-the-counter antihistamine as needed

## 2015-03-07 NOTE — Progress Notes (Signed)
Subjective:    Patient ID: Terry Hull, female    DOB: 1956-02-16, 59 y.o.   MRN: [id number]  HPI  patient is here today for annual physical. Patient feels well overall  Also reviewed chronic medical conditions, interval events and current concerns  Past Medical History  Diagnosis Date  . DIABETES MELLITUS, TYPE II     follows with endo  . HYPERTRIGLYCERIDEMIA   . HYPERTENSION   . ALLERGIC RHINITIS   . ARTHRITIS   . SLEEP APNEA   . Abnormal electrocardiogram     consistent with LVH hypertrophy   Family History  Problem Relation Age of Onset  . Cancer Mother     Ovarian Cancer  . Hyperlipidemia Mother   . Hypertension Mother   . Diabetes Other     1st degree relative   Social History  Substance Use Topics  . Smoking status: Former Smoker -- 1.00 packs/day    Quit date: 12/29/2012  . Smokeless tobacco: None  . Alcohol Use: Yes    Review of Systems  Constitutional: Positive for fatigue. Negative for unexpected weight change.  HENT: Positive for postnasal drip, sinus pressure and sore throat (in mornings). Negative for rhinorrhea and sneezing.   Respiratory: Positive for cough (at night x 6 wk, dry). Negative for shortness of breath and wheezing.   Cardiovascular: Negative for chest pain, palpitations and leg swelling.  Gastrointestinal: Negative for nausea, abdominal pain and diarrhea.  Neurological: Negative for dizziness, weakness, light-headedness and headaches.  Psychiatric/Behavioral: Negative for dysphoric mood. The patient is not nervous/anxious.   All other systems reviewed and are negative.      Objective:    Physical Exam  Constitutional: Terry Hull is oriented to person, place, and time. Terry Hull appears well-developed and well-nourished. No distress.  obese  HENT:  Head: Normocephalic and atraumatic.  Right Ear: External ear normal.  Left Ear: External ear normal.  Nose: Nose normal.  Mouth/Throat: Oropharynx is clear and moist. No oropharyngeal  exudate.  Clear PND with mild erythema  Eyes: EOM are normal. Pupils are equal, round, and reactive to light. Right eye exhibits no discharge. Left eye exhibits no discharge. No scleral icterus.  Neck: Normal range of motion. Neck supple. No JVD present. No tracheal deviation present. No thyromegaly present.  Cardiovascular: Normal rate, regular rhythm, normal heart sounds and intact distal pulses.  Exam reveals no friction rub.   No murmur heard. Pulmonary/Chest: Effort normal and breath sounds normal. No respiratory distress. Terry Hull has no wheezes. Terry Hull has no rales. Terry Hull exhibits no tenderness.  Abdominal: Soft. Bowel sounds are normal. Terry Hull exhibits no distension and no mass. There is no tenderness. There is no rebound and no guarding.  Musculoskeletal: Normal range of motion.  Lymphadenopathy:    Terry Hull has no cervical adenopathy.  Neurological: Terry Hull is alert and oriented to person, place, and time. Terry Hull has normal reflexes. No cranial nerve deficit.  Skin: Skin is warm and dry. No rash noted. Terry Hull is not diaphoretic. No erythema.  Psychiatric: Terry Hull has a normal mood and affect. Terry Hull behavior is normal. Judgment and thought content normal.  Nursing note and vitals reviewed.   BP 138/82 mmHg  Pulse 72  Temp(Src) 98.5 F (36.9 C) (Oral)  Ht 5' 5.5" (1.664 m)  Wt 174 lb 4 oz (79.039 kg)  BMI 28.55 kg/m2  SpO2 97% Wt Readings from Last 3 Encounters:  03/07/15 174 lb 4 oz (79.039 kg)  01/21/15 177 lb (80.287 kg)  01/21/15 177 lb 9.6 oz (  80.559 kg)    Lab Results  Component Value Date   WBC 9.5 12/01/2013   HGB 12.4 12/01/2013   HCT 37.2 12/01/2013   PLT 321.0 12/01/2013   GLUCOSE 131* 01/21/2015   CHOL 223* 12/01/2013   TRIG * 12/01/2013    400.0 Triglyceride is over 400; calculations on Lipids are invalid.   HDL 54.70 12/01/2013   LDLDIRECT 108.5 12/01/2013   LDLCALC  02/05/2010    54 (NOTE)  Total Cholesterol/HDL Ratio:CHD Risk                       Coronary Heart Disease Risk Table                                        Men       Women         1/2 Average Risk              3.4        3.3             Average Risk              5.0         4.4         2 X Average Risk              9.6        7.1         3 X Average Risk             23.4       11.0 Use the calculated Patient Ratio above and the CHD Risk table  to determine the patient's CHD Risk. ATP III Classification (LDL):      < 100         mg/dL         Optimal     914 - 129     mg/dL         Near or Above Optimal     130 - 159     mg/dL         Borderline High     160 - 189     mg/dL         High      > 782        mg/dL         Very High    ALT 26 12/01/2013   AST 19 12/01/2013   NA 140 01/21/2015   K 4.0 01/21/2015   CL 100 01/21/2015   CREATININE 0.99 01/21/2015   BUN 19 01/21/2015   CO2 31 01/21/2015   TSH 0.58 01/21/2015   HGBA1C 9.1* 11/09/2014   MICROALBUR 1.6 12/01/2013    Dg Chest 2 View  10/07/2013  CLINICAL DATA:  Hemoptysis EXAM: CHEST  2 VIEW COMPARISON:  January 05, 2013 FINDINGS: There is no edema or consolidation. Heart is upper normal in size with normal pulmonary vascularity. No adenopathy. No bone lesions. IMPRESSION: No edema or consolidation. Electronically Signed   By: Bretta Bang M.D.   On: 10/07/2013 15:25       Assessment & Plan:   CPX/z00.01 - Patient has been counseled on age-appropriate routine health concerns for screening and prevention. These are reviewed and up-to-date. Immunizations are up-to-date or declined. Labs ordered and reviewed.  Problem List Items Addressed This  Visit    Allergic rhinitis    Seasonal flares in the past 6 weeks with postnasal drainage and nocturnal cough. Lungs clear and exam. Recommend nasal steroid and over-the-counter antihistamine as needed      Essential hypertension    BP Readings from Last 3 Encounters:  03/07/15 138/82  01/21/15 134/76  12/24/14 130/70   The current medical regimen is generally effective;  continue present plan and  medications.       Relevant Medications   hydrochlorothiazide (MICROZIDE) 12.5 MG capsule   Type 2 diabetes mellitus with hyperglycemia, without long-term current use of insulin (HCC)    Diet controlled until 2013 - rx Actos due to a1c >10 then briefly lost to follow-up check a1c, Cr and microalb now - also lipids  On ACEI and statin Reviewed weight gain and influence of same on glycemic control 11/2014: stopped actos and started victoza -  Change victoza to Byetta BID now for formulary pref Has been evaluated by endocrinologist in the past year, continue follow-up there as planned  Lab Results  Component Value Date   HGBA1C  Value: 6.5 (NOTE)                                                           02/05/2010   Lab Results  Component Value Date   HGBA1C 9.1* 11/09/2014        Relevant Medications   exenatide (BYETTA) 10 MCG/0.04ML SOPN injection   Other Relevant Orders   Hemoglobin A1c   Microalbumin / creatinine urine ratio    Other Visit Diagnoses    Routine general medical examination at a health care facility    -  Primary    Relevant Orders    Basic metabolic panel    CBC with Differential/Platelet    Hepatic function panel    Lipid panel    TSH        Rene PaciValerie Quint Chestnut, MD

## 2015-03-07 NOTE — Patient Instructions (Signed)
It was good to see you today.  We have reviewed your prior records including labs and tests today  Annual flu shot immunization updated today. Other Health Maintenance reviewed and all recommended immunizations and age-appropriate screenings are up-to-date.  Test(s) ordered today. Your results will be released to MyChart (or called to you) after review, usually within 72hours after test completion. If any changes need to be made, you will be notified at that same time.  Medications reviewed and updated Stop Pitocin and begin Byetta twice daily for diabetes control as per insurance formulary. No other prescription changes recommended at this time.  Your prescription(s) and refills have been submitted to your pharmacy. Please take as directed and contact our office if you believe you are having problem(s) with the medication(s).  Please schedule followup in 12 months for annual exam and labs, call sooner if problems. Either Tammy SoursGreg or Dr. Lawerance BachBurns would be great!

## 2015-03-07 NOTE — Assessment & Plan Note (Signed)
BP Readings from Last 3 Encounters:  03/07/15 138/82  01/21/15 134/76  12/24/14 130/70   The current medical regimen is generally effective;  continue present plan and medications.

## 2015-03-08 MED ORDER — INSULIN PEN NEEDLE 31G X 5 MM MISC: Status: AC

## 2015-03-18 ENCOUNTER — Other Ambulatory Visit: Payer: PRIVATE HEALTH INSURANCE

## 2015-03-23 ENCOUNTER — Ambulatory Visit: Payer: PRIVATE HEALTH INSURANCE | Admitting: Endocrinology

## 2015-04-01 ENCOUNTER — Ambulatory Visit: Payer: PRIVATE HEALTH INSURANCE | Admitting: Dietician

## 2015-05-16 ENCOUNTER — Other Ambulatory Visit: Payer: Self-pay | Admitting: Internal Medicine

## 2015-05-19 ENCOUNTER — Other Ambulatory Visit: Payer: Self-pay

## 2015-05-19 NOTE — Telephone Encounter (Signed)
rx rq to [facility] for Alprazolam 0.5 mg.   pls advise if this is appropriate

## 2015-05-19 NOTE — Telephone Encounter (Signed)
Okay to refill, but she has an appointment with me in November and should be seen before that since she is on a controlled substance.

## 2015-05-23 NOTE — Telephone Encounter (Signed)
LVM for pt to call back as soon as possible.   RE: need to schedule an appt.

## 2015-05-24 NOTE — Telephone Encounter (Signed)
LVM for pt to call back as soon as possible.   

## 2015-05-26 ENCOUNTER — Other Ambulatory Visit: Payer: Self-pay | Admitting: *Deleted

## 2015-05-26 MED ORDER — METFORMIN HCL ER 500 MG PO TB24: 2000.0000 mg | ORAL_TABLET | Freq: Every day | ORAL | Status: DC

## 2015-05-26 NOTE — Telephone Encounter (Signed)
Is not moving appt up because she states she already has had everything done.

## 2015-06-08 ENCOUNTER — Ambulatory Visit (INDEPENDENT_AMBULATORY_CARE_PROVIDER_SITE_OTHER): Payer: PRIVATE HEALTH INSURANCE | Admitting: Endocrinology

## 2015-06-08 VITALS — BP 136/82 | HR 68 | Ht 65.5 in | Wt 172.2 lb

## 2015-06-08 DIAGNOSIS — E1165 Type 2 diabetes mellitus with hyperglycemia: Secondary | ICD-10-CM

## 2015-06-08 LAB — POCT GLYCOSYLATED HEMOGLOBIN (HGB A1C): HEMOGLOBIN A1C: 6

## 2015-06-08 MED ORDER — METFORMIN HCL ER 500 MG PO TB24: 2000.0000 mg | ORAL_TABLET | Freq: Every day | ORAL | Status: DC

## 2015-06-08 MED ORDER — DULAGLUTIDE 1.5 MG/0.5ML ~~LOC~~ SOAJ: SUBCUTANEOUS | Status: DC

## 2015-06-08 NOTE — Patient Instructions (Addendum)
   Check blood sugars on waking up  3  times a week Also check blood sugars about 2 hours after a meal and do this after different meals by rotation  Recommended blood sugar levels on waking up is 90-130 and about 2 hours after meal is 130-160  Please bring your blood sugar monitor to each visit, thank you  Start TRULICITYwith the pen as shown once weekly on the same day of the week.  You may inject in the stomach, thigh or arm as indicated in the brochure given.  You will feel fullness of the stomach with starting the medication and should try to keep the portions at meals small.   You may experience nausea in the first few days which usually gets better over time   If any questions or concerns are present call the office or the  [facility] at [phone].   Also visit [email or web address] website for more useful information

## 2015-06-08 NOTE — Progress Notes (Signed)
Patient ID: Terry Hull, female   DOB: 15-Aug-1955, 60 y.o.   MRN: [id number]           Reason for Appointment: Follow-up for Type 2 Diabetes   History of Present Illness:          Date of diagnosis of type 2 diabetes mellitus:   2013       Background history:   Patient had been complaining of fatigue at the time of diagnosis and lab glucose indicated her level was 453 She was initially treated with metformin but she said that since this made her sleepy she did not continue this Subsequently he has been on the variety of medications including Januvia which she is still taking She thinks she had fairly good control with Actos 45 mg but was stopped because of weight gain in 11/2013 At that time she was started on Victoza and this has been continued However her A1c has continued to increase since 2015.  Recent history:   Her blood sugars were significantly high prior to her initial consultation On her initial visit she was started on metformin and Januvia discontinued. Victoza was continued  She has not been seen in follow-up since 9/16 Since her insurance was not covering Victoza her PCP changed this to Byetta She also was changed from metformin to metformin ER which she is tolerating much better  Current blood sugar patterns and problems identified:   She has checked her blood sugars only before breakfast and supper  She says because of vomiting and nausea from Byetta she is not taking this about 3 hours before breakfast  Since she was having vomiting with the evening injection of 10 g she is taking 2 injections of the 10 g in the mornings now  Her fasting readings are still mildly high but overall still better than the last time  Blood sugars are fairly good at suppertime but she is not monitoring his readings after meals  She has been able to lose weight  She is trying to walk 3 times a week   Hypoglycemic drugs the patient is taking are: Metformin ER 2000 mg a day,  Byetta 20 g in a.m. daily      Side effects from medications have been:Weight gain form Actos   Compliance with the medical regimen: Fair  Glucose monitoring:  done usually 1-2 times a day         Glucometer: One Touch.      Blood Glucose readings by download:  Mean values apply above for all meters except median for One Touch  PRE-MEAL Fasting Lunch Dinner Bedtime Overall  Glucose range:  119-167    92-159     Mean/median:  132    107   126    Self-care: The diet that the patient has been following is: None Typical meal intake: Breakfast is egg muffin, lunch is a salad, dinner is a sandwich and has snacks with food.  Avoiding drinks with sugar and her soft drinks are diet                Dietician visit, most recent: 01/21/15               Exercise: Not doing much recently because of fatigue    Weight history: Previous range 145-201  Wt Readings from Last 3 Encounters:  06/08/15 172 lb 3.2 oz (78.109 kg)  03/07/15 174 lb 4 oz (79.039 kg)  01/21/15 177 lb (80.287 kg)    Glycemic  control:   Lab Results  Component Value Date   HGBA1C 6.0 06/08/2015   HGBA1C 6.7* 03/07/2015   HGBA1C 9.1* 11/09/2014   Lab Results  Component Value Date   MICROALBUR 11.9* 03/07/2015   LDLCALC 82 03/07/2015   CREATININE 0.83 03/07/2015         Medication List       This list is accurate as of: 06/08/15  4:41 PM.  Always use your most recent med list.               albuterol 108 (90 Base) MCG/ACT inhaler  Commonly known as:  PROVENTIL HFA;VENTOLIN HFA  Inhale 2 puffs into the lungs every 6 (six) hours as needed for wheezing or shortness of breath.     ALPRAZolam 0.5 MG tablet  Commonly known as:  XANAX  Take 1 tablet (0.5 mg total) by mouth 3 (three) times daily as needed for anxiety or sleep.     amLODipine 5 MG tablet  Commonly known as:  NORVASC  Take 1 tablet (5 mg total) by mouth daily.     carvedilol 6.25 MG tablet  Commonly known as:  COREG  Take 1 tablet (6.25 mg  total) by mouth 2 (two) times daily with a meal.     citalopram 20 MG tablet  Commonly known as:  CELEXA  Take 1 tablet (20 mg total) by mouth daily.     diclofenac 75 MG EC tablet  Commonly known as:  VOLTAREN  Take 1 tablet (75 mg total) by mouth 2 (two) times daily as needed for mild pain or moderate pain.     Dulaglutide 1.5 MG/0.5ML Sopn  Commonly known as:  TRULICITY  Inject SQ weekly     fluticasone 50 MCG/ACT nasal spray  Commonly known as:  FLONASE  Place 1 spray into both nostrils daily.     glucose blood test strip  Commonly known as:  ONE TOUCH TEST STRIPS  Use check blood sugar before breakfast and bedtime     hydrochlorothiazide 12.5 MG capsule  Commonly known as:  MICROZIDE  Take 1 capsule (12.5 mg total) by mouth daily.     Insulin Pen Needle 31G X 5 MM Misc  Use to inject insulin twice per day as directed with rx for bByetta. DX E11.09     lisinopril 40 MG tablet  Commonly known as:  PRINIVIL,ZESTRIL  Take 1 tablet (40 mg total) by mouth daily.     metFORMIN 500 MG 24 hr tablet  Commonly known as:  GLUCOPHAGE-XR  Take 4 tablets (2,000 mg total) by mouth daily with supper.     ONE TOUCH LANCETS Misc  Use to help check blood sugar twice a day     pravastatin 20 MG tablet  Commonly known as:  PRAVACHOL  Take 1 tablet (20 mg total) by mouth daily.        Allergies: No Known Allergies  Past Medical History  Diagnosis Date  . DIABETES MELLITUS, TYPE II     follows with endo  . HYPERTRIGLYCERIDEMIA   . HYPERTENSION   . ALLERGIC RHINITIS   . ARTHRITIS   . SLEEP APNEA   . Abnormal electrocardiogram     consistent with LVH hypertrophy    Past Surgical History  Procedure Laterality Date  . Nasal polyp surgery    . Abdominal hysterectomy    . Hand tendon surgery      Family History  Problem Relation Age of Onset  . Cancer Mother  Ovarian Cancer  . Hyperlipidemia Mother   . Hypertension Mother   . Diabetes Other     1st degree  relative    Social History:  reports that she quit smoking about 2 years ago. She does not have any smokeless tobacco history on file. She reports that she drinks alcohol. She reports that she does not use illicit drugs.    Review of Systems    Lipid history: On pravastatin for several years, has previously had  high triglycerides also    Lab Results  Component Value Date   CHOL 159 03/07/2015   HDL 49.40 03/07/2015   LDLCALC 82 03/07/2015   LDLDIRECT 108.5 12/01/2013   TRIG 139.0 03/07/2015   CHOLHDL 3 03/07/2015            Eyes:  Most recent eye exam was 7/16  Hypertension: Treated with medications for 6-7 years   No history of thyroid disease  Lab Results  Component Value Date   TSH 0.58 01/21/2015      LABS:  Office Visit on 06/08/2015  Component Date Value Ref Range Status  . Hemoglobin A1C 06/08/2015 6.0   Final    Physical Examination:  BP 136/82 mmHg  Pulse 68  Ht 5' 5.5" (1.664 m)  Wt 172 lb 3.2 oz (78.109 kg)  BMI 28.21 kg/m2  SpO2 94%       ASSESSMENT:  Diabetes type 2,  with obesity  She has had improved blood sugar control with a GLP-1 and metformin  See history of present illness for detailed discussion of his current management, blood sugar patterns and problems identified Although she was doing very well with Victoza without any side effects she is now on Byetta from her PCP because of insurance preference She has not tolerated this because of nausea and vomiting On her own she is only taking the Byetta once a day in the morning but taking 20 g and continuing to have some nausea She tends to have relatively high fasting readings but readings at suppertime are good No readings after meals Her diet and exercise regimen of good and she is losing weight  PLAN:   Trial of Trulicity 1.5 mg weekly.  She was given a brochure, showed her how to use the Trulicity pen  She will continue metformin ER 2000 mg a day  More consistent blood  sugar monitoring after meals  Follow-up in 3 months  Patient Instructions     Check blood sugars on waking up  3  times a week Also check blood sugars about 2 hours after a meal and do this after different meals by rotation  Recommended blood sugar levels on waking up is 90-130 and about 2 hours after meal is 130-160  Please bring your blood sugar monitor to each visit, thank you  Start TRULICITYwith the pen as shown once weekly on the same day of the week.  You may inject in the stomach, thigh or arm as indicated in the brochure given.  You will feel fullness of the stomach with starting the medication and should try to keep the portions at meals small.   You may experience nausea in the first few days which usually gets better over time   If any questions or concerns are present call the office or the  [facility] at [phone].   Also visit [email or web address] website for more useful information        Lateya Dauria 06/08/2015, 4:41 PM   Note: This office note  was prepared with Insurance underwriter. Any transcriptional errors that result from this process are unintentional.  Addendum: Fructosamine mildly increased at 295

## 2015-06-30 ENCOUNTER — Other Ambulatory Visit: Payer: Self-pay | Admitting: Family

## 2015-07-06 NOTE — Telephone Encounter (Signed)
FYI

## 2015-08-28 ENCOUNTER — Encounter: Payer: Self-pay | Admitting: Endocrinology

## 2015-08-29 ENCOUNTER — Other Ambulatory Visit: Payer: Self-pay | Admitting: *Deleted

## 2015-08-29 MED ORDER — DULAGLUTIDE 1.5 MG/0.5ML ~~LOC~~ SOAJ: SUBCUTANEOUS | Status: DC

## 2015-09-06 ENCOUNTER — Other Ambulatory Visit: Payer: PRIVATE HEALTH INSURANCE

## 2015-09-07 ENCOUNTER — Other Ambulatory Visit (INDEPENDENT_AMBULATORY_CARE_PROVIDER_SITE_OTHER): Payer: PRIVATE HEALTH INSURANCE

## 2015-09-07 DIAGNOSIS — E1165 Type 2 diabetes mellitus with hyperglycemia: Secondary | ICD-10-CM | POA: Diagnosis not present

## 2015-09-07 LAB — COMPREHENSIVE METABOLIC PANEL
ALK PHOS: 45 U/L (ref 39–117)
ALT: 26 U/L (ref 0–35)
AST: 17 U/L (ref 0–37)
Albumin: 4.4 g/dL (ref 3.5–5.2)
BILIRUBIN TOTAL: 0.4 mg/dL (ref 0.2–1.2)
BUN: 19 mg/dL (ref 6–23)
CALCIUM: 9.8 mg/dL (ref 8.4–10.5)
CO2: 30 mEq/L (ref 19–32)
Chloride: 99 mEq/L (ref 96–112)
Creatinine, Ser: 0.91 mg/dL (ref 0.40–1.20)
GFR: 80.93 mL/min (ref >60.00)
GLUCOSE: 108 mg/dL — AB (ref 70–99)
Potassium: 3.9 mEq/L (ref 3.5–5.1)
Sodium: 138 mEq/L (ref 135–145)
TOTAL PROTEIN: 7 g/dL (ref 6.0–8.3)

## 2015-09-07 LAB — HEMOGLOBIN A1C: HEMOGLOBIN A1C: 6.3 % (ref 4.6–6.5)

## 2015-09-09 ENCOUNTER — Ambulatory Visit: Payer: PRIVATE HEALTH INSURANCE | Admitting: Endocrinology

## 2015-09-21 ENCOUNTER — Encounter: Payer: Self-pay | Admitting: Endocrinology

## 2015-09-21 ENCOUNTER — Ambulatory Visit (INDEPENDENT_AMBULATORY_CARE_PROVIDER_SITE_OTHER): Payer: Managed Care, Other (non HMO) | Admitting: Endocrinology

## 2015-09-21 VITALS — BP 134/82 | HR 65 | Temp 97.9°F | Resp 14 | Ht 65.0 in | Wt 172.0 lb

## 2015-09-21 DIAGNOSIS — E119 Type 2 diabetes mellitus without complications: Secondary | ICD-10-CM

## 2015-09-21 NOTE — Progress Notes (Signed)
Patient ID: Terry Hull, female   DOB: [birth year], 60 y.o.   MRN: [id number]           Reason for Appointment: Follow-up for Type 2 Diabetes   History of Present Illness:          Date of diagnosis of type 2 diabetes mellitus:   2013       Background history:   Patient had been complaining of fatigue at the time of diagnosis and lab glucose indicated her level was 453 She was initially treated with metformin but she said that since this made her sleepy she did not continue this Subsequently he has been on the variety of medications including Januvia which she is still taking She thinks she had fairly good control with Actos 45 mg but was stopped because of weight gain in 11/2013 At that time she was started on Victoza Her blood sugars were significantly high prior to her initial consultation. On her initial visit she was started on metformin  Recent history:   Since her insurance did not cover Victoza she had been on Byetta and this was causing nausea; was changed to Trulicity 1.5 mg in 2/17 She also was changed from metformin to metformin ER which she is tolerating much better A1c is still excellent at 6.3  Current blood sugar patterns and problems identified:   She has checked her blood sugars only before breakfast and none after meals  Her fasting blood sugars tend to be mildly increased, was 108 in the lab  She has no side effects from Trulicity once a week and it helps her with portion control  Hasn't been taking her metformin 2000 mg also  She has been able to lose weight  She is trying to walk 3 times a week   Hypoglycemic drugs the patient is taking are: Metformin ER 2000 mg a day     Side effects from medications have been:Weight gain form Actos   Compliance with the medical regimen: Fair  Glucose monitoring:  done usually 1-2 times a day         Glucometer: One Touch.      Blood Glucose readings by download:  Mean values apply above for all meters except  median for One Touch  PRE-MEAL Fasting Lunch Dinner Bedtime Overall  Glucose range: 112-155       Mean/median:     127     Self-care:  Typical meal intake: Breakfast is egg muffin, lunch is a salad, dinner is a sandwich and has snacks with food.  Avoiding drinks with sugar and her soft drinks are diet                Dietician visit, most recent: 01/21/15               Exercise: Walking 30 minutes, 3/7  Weight history: Previous range 145-201  Wt Readings from Last 3 Encounters:  09/21/15 172 lb (78.019 kg)  06/08/15 172 lb 3.2 oz (78.109 kg)  03/07/15 174 lb 4 oz (79.039 kg)    Glycemic control:   Lab Results  Component Value Date   HGBA1C 6.3 09/07/2015   HGBA1C 6.0 06/08/2015   HGBA1C 6.7* 03/07/2015   Lab Results  Component Value Date   MICROALBUR 11.9* 03/07/2015   LDLCALC 82 03/07/2015   CREATININE 0.91 09/07/2015         Medication List       This list is accurate as of: 09/21/15  8:20 AM.  Always use your most recent med list.               albuterol 108 (90 Base) MCG/ACT inhaler  Commonly known as:  PROVENTIL HFA;VENTOLIN HFA  Inhale 2 puffs into the lungs every 6 (six) hours as needed for wheezing or shortness of breath.     amLODipine 5 MG tablet  Commonly known as:  NORVASC  Take 1 tablet (5 mg total) by mouth daily.     carvedilol 6.25 MG tablet  Commonly known as:  COREG  Take 1 tablet (6.25 mg total) by mouth 2 (two) times daily with a meal.     citalopram 20 MG tablet  Commonly known as:  CELEXA  Take 1 tablet (20 mg total) by mouth daily.     diclofenac 75 MG EC tablet  Commonly known as:  VOLTAREN  Take 1 tablet (75 mg total) by mouth 2 (two) times daily as needed for mild pain or moderate pain.     Dulaglutide 1.5 MG/0.5ML Sopn  Commonly known as:  TRULICITY  Inject SQ weekly     fluticasone 50 MCG/ACT nasal spray  Commonly known as:  FLONASE  Place 1 spray into both nostrils daily.     hydrochlorothiazide 12.5 MG capsule    Commonly known as:  MICROZIDE  Take 1 capsule (12.5 mg total) by mouth daily.     Insulin Pen Needle 31G X 5 MM Misc  Use to inject insulin twice per day as directed with rx for bByetta. DX E11.09     lisinopril 40 MG tablet  Commonly known as:  PRINIVIL,ZESTRIL  Take 1 tablet (40 mg total) by mouth daily.     metFORMIN 500 MG 24 hr tablet  Commonly known as:  GLUCOPHAGE-XR  Take 4 tablets (2,000 mg total) by mouth daily with supper.     ONE TOUCH LANCETS Misc  Use to help check blood sugar twice a day     ONE TOUCH ULTRA TEST test strip  Generic drug:  glucose blood  USE TO CHECK BLOOD SUGAR BEFORE BREAKFAST AND BEDTIME     pravastatin 20 MG tablet  Commonly known as:  PRAVACHOL  Take 1 tablet (20 mg total) by mouth daily.        Allergies: No Known Allergies  Past Medical History  Diagnosis Date  . DIABETES MELLITUS, TYPE II     follows with endo  . HYPERTRIGLYCERIDEMIA   . HYPERTENSION   . ALLERGIC RHINITIS   . ARTHRITIS   . SLEEP APNEA   . Abnormal electrocardiogram     consistent with LVH hypertrophy    Past Surgical History  Procedure Laterality Date  . Nasal polyp surgery    . Abdominal hysterectomy    . Hand tendon surgery      Family History  Problem Relation Age of Onset  . Cancer Mother     Ovarian Cancer  . Hyperlipidemia Mother   . Hypertension Mother   . Diabetes Other     1st degree relative    Social History:  reports that she quit smoking about 2 years ago. She does not have any smokeless tobacco history on file. She reports that she drinks alcohol. She reports that she does not use illicit drugs.    Review of Systems    Lipid history: On pravastatin for several years, has previously had  high triglycerides also    Lab Results  Component Value Date   CHOL 159 03/07/2015   HDL  49.40 03/07/2015   LDLCALC 82 03/07/2015   LDLDIRECT 108.5 12/01/2013   TRIG 139.0 03/07/2015   CHOLHDL 3 03/07/2015           No symptoms of  numbness are tending in her feet  Eyes:  Most recent eye exam was 7/16  Hypertension: Treated with medications for 6-7 years, On 40 mg lisinopril, followed by PCP annually   LABS:  No visits with results within 1 Week(s) from this visit. Latest known visit with results is:  Lab on 09/07/2015  Component Date Value Ref Range Status  . Hgb A1c MFr Bld 09/07/2015 6.3  4.6 - 6.5 % Final   Glycemic Control Guidelines for People with Diabetes:Non Diabetic:  <6%Goal of Therapy: <7%Additional Action Suggested:  >8%   . Sodium 09/07/2015 138  135 - 145 mEq/L Final  . Potassium 09/07/2015 3.9  3.5 - 5.1 mEq/L Final  . Chloride 09/07/2015 99  96 - 112 mEq/L Final  . CO2 09/07/2015 30  19 - 32 mEq/L Final  . Glucose, Bld 09/07/2015 108* 70 - 99 mg/dL Final  . BUN 40/98/1191 19  6 - 23 mg/dL Final  . Creatinine, Ser 09/07/2015 0.91  0.40 - 1.20 mg/dL Final  . Total Bilirubin 09/07/2015 0.4  0.2 - 1.2 mg/dL Final  . Alkaline Phosphatase 09/07/2015 45  39 - 117 U/L Final  . AST 09/07/2015 17  0 - 37 U/L Final  . ALT 09/07/2015 26  0 - 35 U/L Final  . Total Protein 09/07/2015 7.0  6.0 - 8.3 g/dL Final  . Albumin 47/82/9562 4.4  3.5 - 5.2 g/dL Final  . Calcium 13/11/6576 9.8  8.4 - 10.5 mg/dL Final  . GFR 46/96/2952 80.93  >60.00 mL/min Final    Physical Examination:  BP 134/82 mmHg  Pulse 65  Temp(Src) 97.9 F (36.6 C)  Resp 14  Ht 5\' 5"  (1.651 m)  Wt 172 lb (78.019 kg)  BMI 28.62 kg/m2  SpO2 97%       ASSESSMENT:  Diabetes type 2,  with obesity  See history of present illness for detailed discussion of  current management, blood sugar patterns and problems identified She has done well with Trulicity with no side effects and able to maintain her weight and following her diet Also tolerating metformin ER without side effects Her fasting blood sugars tend to be mildly increased, lowest reading 108 in the lab Discussed that she may have higher readings at times after meals  especially after supper which she is not monitoring and did not understand these blood sugar targets  HYPERTENSION: Appears well-controlled  PLAN:   Continue Trulicity 1.5 mg weekly.    Encourage her to walk more regularly  She will continue metformin ER 2000 mg a day  To start consistent blood sugar monitoring after meals  Follow-up in 4 months  Patient Instructions  Check blood sugars on waking up 2-3  times a week Also check blood sugars about 2 hours after a meal and do this after different meals by rotation  Recommended blood sugar levels on waking up is 90-130 and about 2 hours after meal is 130-160  Please bring your blood sugar monitor to each visit, thank you  Walk daily    Terry Hull 09/21/2015, 8:20 AM   Note: This office note was prepared with Dragon voice recognition system technology. Any transcriptional errors that result from this process are unintentional.  /

## 2015-09-21 NOTE — Patient Instructions (Signed)
Check blood sugars on waking up 2-3  times a week Also check blood sugars about 2 hours after a meal and do this after different meals by rotation  Recommended blood sugar levels on waking up is 90-130 and about 2 hours after meal is 130-160  Please bring your blood sugar monitor to each visit, thank you  Walk daily  

## 2015-09-23 ENCOUNTER — Other Ambulatory Visit: Payer: Self-pay | Admitting: Family

## 2015-10-20 ENCOUNTER — Other Ambulatory Visit: Payer: Self-pay | Admitting: Endocrinology

## 2015-10-28 ENCOUNTER — Other Ambulatory Visit: Payer: Self-pay | Admitting: Endocrinology

## 2015-11-24 ENCOUNTER — Other Ambulatory Visit: Payer: Self-pay | Admitting: Family

## 2015-11-24 NOTE — Telephone Encounter (Signed)
Please advice, pt has est care appt in Nov, last filled 09/23/15

## 2015-12-07 ENCOUNTER — Other Ambulatory Visit: Payer: Self-pay | Admitting: Internal Medicine

## 2015-12-09 ENCOUNTER — Other Ambulatory Visit: Payer: Self-pay | Admitting: *Deleted

## 2015-12-09 MED ORDER — AMLODIPINE BESYLATE 5 MG PO TABS: 5.0000 mg | ORAL_TABLET | Freq: Every day | ORAL | 0 refills | Status: DC

## 2015-12-09 MED ORDER — HYDROCHLOROTHIAZIDE 12.5 MG PO CAPS: 12.5000 mg | ORAL_CAPSULE | Freq: Every day | ORAL | 0 refills | Status: DC

## 2015-12-09 NOTE — Telephone Encounter (Signed)
Left msg on triage stating she has made appt for her CPX w/Dr. Lawerance BachBurns but its not until Nov. She is needing refill on her BP meds until appt. Called pt no answer LMOM meds sent to [facility]...Raechel Chute/lmb

## 2016-01-01 ENCOUNTER — Other Ambulatory Visit: Payer: Self-pay | Admitting: Endocrinology

## 2016-01-15 ENCOUNTER — Other Ambulatory Visit: Payer: Self-pay | Admitting: Endocrinology

## 2016-01-16 ENCOUNTER — Other Ambulatory Visit: Payer: Self-pay | Admitting: Endocrinology

## 2016-01-18 ENCOUNTER — Other Ambulatory Visit (INDEPENDENT_AMBULATORY_CARE_PROVIDER_SITE_OTHER): Payer: Managed Care, Other (non HMO)

## 2016-01-18 DIAGNOSIS — E119 Type 2 diabetes mellitus without complications: Secondary | ICD-10-CM

## 2016-01-18 DIAGNOSIS — R7989 Other specified abnormal findings of blood chemistry: Secondary | ICD-10-CM

## 2016-01-18 DIAGNOSIS — IMO0001 Reserved for inherently not codable concepts without codable children: Secondary | ICD-10-CM

## 2016-01-18 DIAGNOSIS — E1165 Type 2 diabetes mellitus with hyperglycemia: Secondary | ICD-10-CM | POA: Diagnosis not present

## 2016-01-18 LAB — COMPREHENSIVE METABOLIC PANEL
ALBUMIN: 4.1 g/dL (ref 3.5–5.2)
ALT: 36 U/L — ABNORMAL HIGH (ref 0–35)
AST: 22 U/L (ref 0–37)
Alkaline Phosphatase: 49 U/L (ref 39–117)
BUN: 19 mg/dL (ref 6–23)
CALCIUM: 9.9 mg/dL (ref 8.4–10.5)
CHLORIDE: 101 meq/L (ref 96–112)
CO2: 31 mEq/L (ref 19–32)
Creatinine, Ser: 0.78 mg/dL (ref 0.40–1.20)
GFR: 96.57 mL/min (ref >60.00)
GLUCOSE: 103 mg/dL — AB (ref 70–99)
Potassium: 4 mEq/L (ref 3.5–5.1)
SODIUM: 140 meq/L (ref 135–145)
TOTAL PROTEIN: 6.8 g/dL (ref 6.0–8.3)
Total Bilirubin: 0.5 mg/dL (ref 0.2–1.2)

## 2016-01-18 LAB — LIPID PANEL
CHOLESTEROL: 159 mg/dL (ref 0–200)
HDL: 58 mg/dL (ref >39.00)
NonHDL: 100.74
TRIGLYCERIDES: 238 mg/dL — AB (ref 0.0–149.0)
Total CHOL/HDL Ratio: 3
VLDL: 47.6 mg/dL — AB (ref 0.0–40.0)

## 2016-01-18 LAB — HEMOGLOBIN A1C: HEMOGLOBIN A1C: 6.4 % (ref 4.6–6.5)

## 2016-01-18 LAB — LDL CHOLESTEROL, DIRECT: Direct LDL: 72 mg/dL

## 2016-01-23 ENCOUNTER — Ambulatory Visit (INDEPENDENT_AMBULATORY_CARE_PROVIDER_SITE_OTHER): Payer: Managed Care, Other (non HMO) | Admitting: Endocrinology

## 2016-01-23 ENCOUNTER — Encounter: Payer: Self-pay | Admitting: Endocrinology

## 2016-01-23 VITALS — BP 142/82 | HR 67 | Temp 97.8°F | Resp 96 | Ht 65.0 in | Wt 175.8 lb

## 2016-01-23 DIAGNOSIS — E119 Type 2 diabetes mellitus without complications: Secondary | ICD-10-CM

## 2016-01-23 LAB — POCT URINALYSIS DIPSTICK
Bilirubin, UA: NEGATIVE
Blood, UA: NEGATIVE
GLUCOSE UA: NEGATIVE
Ketones, UA: NEGATIVE
LEUKOCYTES UA: NEGATIVE
NITRITE UA: NEGATIVE
PROTEIN UA: 30
SPEC GRAV UA: 1.025
UROBILINOGEN UA: 0.2
pH, UA: 6

## 2016-01-23 LAB — MICROALBUMIN / CREATININE URINE RATIO
Creatinine,U: 252.1 mg/dL
Microalb Creat Ratio: 7.5 mg/g (ref 0.0–30.0)
Microalb, Ur: 18.8 mg/dL — ABNORMAL HIGH (ref 0.0–1.9)

## 2016-01-23 NOTE — Progress Notes (Signed)
Patient ID: Terry Hull, female   DOB: 06-23-1955, 60 y.o.   MRN: [id number]           Reason for Appointment: Follow-up for Type 2 Diabetes   History of Present Illness:          Date of diagnosis of type 2 diabetes mellitus:   2013       Background history:   Patient had been complaining of fatigue at the time of diagnosis and lab glucose indicated her level was 453 She was initially treated with metformin but she said that since this made her sleepy she did not continue this Subsequently he has been on the variety of medications including Januvia which she is still taking She thinks she had fairly good control with Actos 45 mg but was stopped because of weight gain in 11/2013 At that time she was started on Victoza Her blood sugars were significantly high prior to her initial consultation. On her initial visit she was started on metformin  Recent history:   She has been on Trulicity 1.5 mg since 2/17 A1c is still excellent at 6.4, previously 6.3  Current blood sugar patterns and problems identified:   She has checked her blood sugars some before meals and she thinks she has done some readings 2 hours after dinner or did not bring her monitor  Her fasting blood sugars tend to be mildly increased, nonfasting lab glucose was 1 0 3  She has no side effects from Trulicity once a week and it helps her with portion control  She has been taking her metformin 2000 mg without side effects also  She is trying to walk but does not like to walk when it is hot and has done this less  Also may have gained weight because of recent vacation   Hypoglycemic drugs the patient is taking are: Metformin ER 2000 mg a day     Side effects from medications have been:Weight gain form Actos   Compliance with the medical regimen: Fair  Glucose monitoring:  done usually 1-2 times a day         Glucometer: One Touch.      Blood Glucose readings by recall:  Mean values apply above for all meters  except median for One Touch  PRE-MEAL Fasting Lunch Dinner Bedtime Overall  Glucose range: 120-130   120-130   Mean/median:          Self-care:  Typical meal intake: Breakfast is egg White omelette with vegetables, lunch is a salad, dinner is a sandwich and has snacks with food.   Avoiding drinks with sugar                Dietician visit, most recent: 01/21/15               Exercise: Walking 30 minutes, 1-3/7  Weight history: Previous range 145-201  Wt Readings from Last 3 Encounters:  01/23/16 175 lb 12.8 oz (79.7 kg)  09/21/15 172 lb (78 kg)  06/08/15 172 lb 3.2 oz (78.1 kg)    Glycemic control:   Lab Results  Component Value Date   HGBA1C 6.4 01/18/2016   HGBA1C 6.3 09/07/2015   HGBA1C 6.0 06/08/2015   Lab Results  Component Value Date   MICROALBUR 11.9 (H) 03/07/2015   LDLCALC 82 03/07/2015   CREATININE 0.78 01/18/2016    Lab on 01/18/2016  Component Date Value Ref Range Status  . Sodium 01/18/2016 140  135 - 145 mEq/L Final  .  Potassium 01/18/2016 4.0  3.5 - 5.1 mEq/L Final  . Chloride 01/18/2016 101  96 - 112 mEq/L Final  . CO2 01/18/2016 31  19 - 32 mEq/L Final  . Glucose, Bld 01/18/2016 103* 70 - 99 mg/dL Final  . BUN 40/98/119109/20/2017 19  6 - 23 mg/dL Final  . Creatinine, Ser 01/18/2016 0.78  0.40 - 1.20 mg/dL Final  . Total Bilirubin 01/18/2016 0.5  0.2 - 1.2 mg/dL Final  . Alkaline Phosphatase 01/18/2016 49  39 - 117 U/L Final  . AST 01/18/2016 22  0 - 37 U/L Final  . ALT 01/18/2016 36* 0 - 35 U/L Final  . Total Protein 01/18/2016 6.8  6.0 - 8.3 g/dL Final  . Albumin 47/82/956209/20/2017 4.1  3.5 - 5.2 g/dL Final  . Calcium 13/08/657809/20/2017 9.9  8.4 - 10.5 mg/dL Final  . GFR 46/96/295209/20/2017 96.57  >60.00 mL/min Final  . Hgb A1c MFr Bld 01/18/2016 6.4  4.6 - 6.5 % Final  . Cholesterol 01/18/2016 159  0 - 200 mg/dL Final  . Triglycerides 01/18/2016 238.0* 0.0 - 149.0 mg/dL Final  . HDL 84/13/244009/20/2017 58.00  >39.00 mg/dL Final  . VLDL 10/27/253609/20/2017 47.6* 0.0 - 40.0 mg/dL Final  .  Total CHOL/HDL Ratio 01/18/2016 3   Final  . NonHDL 01/18/2016 100.74   Final  . Direct LDL 01/18/2016 72.0  mg/dL Final        Medication List       Accurate as of 01/23/16  8:06 AM. Always use your most recent med list.          albuterol 108 (90 Base) MCG/ACT inhaler Commonly known as:  PROVENTIL HFA;VENTOLIN HFA Inhale 2 puffs into the lungs every 6 (six) hours as needed for wheezing or shortness of breath.   amLODipine 5 MG tablet Commonly known as:  NORVASC Take 1 tablet (5 mg total) by mouth daily. Keep Nov appt w/new PCP for future refills   carvedilol 6.25 MG tablet Commonly known as:  COREG Take 1 tablet (6.25 mg total) by mouth 2 (two) times daily with a meal.   citalopram 20 MG tablet Commonly known as:  CELEXA Take 1 tablet (20 mg total) by mouth daily.   diclofenac 75 MG EC tablet Commonly known as:  VOLTAREN TAKE 1 TABLET(75 MG) BY MOUTH TWICE DAILY AS NEEDED FOR MILD PAIN OR MODERATE PAIN   fluticasone 50 MCG/ACT nasal spray Commonly known as:  FLONASE Place 1 spray into both nostrils daily.   hydrochlorothiazide 12.5 MG capsule Commonly known as:  MICROZIDE Take 1 capsule (12.5 mg total) by mouth daily. Keep Nov appt w/new MD for future refills   Insulin Pen Needle 31G X 5 MM Misc Use to inject insulin twice per day as directed with rx for bByetta. DX E11.09   lisinopril 40 MG tablet Commonly known as:  PRINIVIL,ZESTRIL Take 1 tablet (40 mg total) by mouth daily.   metFORMIN 500 MG 24 hr tablet Commonly known as:  GLUCOPHAGE-XR TAKE 4 TABLETS(2000 MG) BY MOUTH DAILY WITH SUPPER   ONE TOUCH LANCETS Misc Use to help check blood sugar twice a day   ONE TOUCH ULTRA TEST test strip Generic drug:  glucose blood USE TO CHECK BLOOD SUGAR BEFORE BREAKFAST AND BEDTIME   pravastatin 20 MG tablet Commonly known as:  PRAVACHOL Take 1 tablet (20 mg total) by mouth daily.   TRULICITY 1.5 MG/0.5ML Sopn Generic drug:  Dulaglutide INJECT UNDER THE  SKIN WEEKLY AS DIRECTED  Allergies: No Known Allergies  Past Medical History:  Diagnosis Date  . Abnormal electrocardiogram    consistent with LVH hypertrophy  . ALLERGIC RHINITIS   . ARTHRITIS   . DIABETES MELLITUS, TYPE II    follows with endo  . HYPERTENSION   . HYPERTRIGLYCERIDEMIA   . SLEEP APNEA     Past Surgical History:  Procedure Laterality Date  . ABDOMINAL HYSTERECTOMY    . HAND TENDON SURGERY    . NASAL POLYP SURGERY      Family History  Problem Relation Age of Onset  . Cancer Mother     Ovarian Cancer  . Hyperlipidemia Mother   . Hypertension Mother   . Diabetes Other     1st degree relative    Social History:  reports that she quit smoking about 3 years ago. She smoked 1.00 pack per day. She does not have any smokeless tobacco history on file. She reports that she drinks alcohol. She reports that she does not use drugs.    Review of Systems    Lipid history: On pravastatin for several years, has previously had  high triglycerides also, Recent labs are nonfasting    Lab Results  Component Value Date   CHOL 159 01/18/2016   HDL 58.00 01/18/2016   LDLCALC 82 03/07/2015   LDLDIRECT 72.0 01/18/2016   TRIG 238.0 (H) 01/18/2016   CHOLHDL 3 01/18/2016           No symptoms of numbness in her feet  Eyes:  Most recent eye exam was 8/16 And she will go back in November  Hypertension: Treated with medications for 6-7 years, On 40 mg lisinopril, followed by PCP annually   LABS:  Lab on 01/18/2016  Component Date Value Ref Range Status  . Sodium 01/18/2016 140  135 - 145 mEq/L Final  . Potassium 01/18/2016 4.0  3.5 - 5.1 mEq/L Final  . Chloride 01/18/2016 101  96 - 112 mEq/L Final  . CO2 01/18/2016 31  19 - 32 mEq/L Final  . Glucose, Bld 01/18/2016 103* 70 - 99 mg/dL Final  . BUN 40/98/1191 19  6 - 23 mg/dL Final  . Creatinine, Ser 01/18/2016 0.78  0.40 - 1.20 mg/dL Final  . Total Bilirubin 01/18/2016 0.5  0.2 - 1.2 mg/dL Final  .  Alkaline Phosphatase 01/18/2016 49  39 - 117 U/L Final  . AST 01/18/2016 22  0 - 37 U/L Final  . ALT 01/18/2016 36* 0 - 35 U/L Final  . Total Protein 01/18/2016 6.8  6.0 - 8.3 g/dL Final  . Albumin 47/82/9562 4.1  3.5 - 5.2 g/dL Final  . Calcium 13/11/6576 9.9  8.4 - 10.5 mg/dL Final  . GFR 46/96/2952 96.57  >60.00 mL/min Final  . Hgb A1c MFr Bld 01/18/2016 6.4  4.6 - 6.5 % Final  . Cholesterol 01/18/2016 159  0 - 200 mg/dL Final  . Triglycerides 01/18/2016 238.0* 0.0 - 149.0 mg/dL Final  . HDL 84/13/2440 58.00  >39.00 mg/dL Final  . VLDL 02/24/2535 47.6* 0.0 - 40.0 mg/dL Final  . Total CHOL/HDL Ratio 01/18/2016 3   Final  . NonHDL 01/18/2016 100.74   Final  . Direct LDL 01/18/2016 72.0  mg/dL Final    Physical Examination:  BP (!) 142/82   Pulse 67   Temp 97.8 F (36.6 C)   Resp (!) 96   Ht 5\' 5"  (1.651 m)   Wt 175 lb 12.8 oz (79.7 kg)   SpO2 (!) 16%   BMI  29.25 kg/m        ASSESSMENT:  Diabetes type 2,  with obesity  See history of present illness for detailed discussion of  current management, blood sugar patterns and problems identified  She has done well withConsistent control using metformin and Trulicity with no side effects She has not exercised as much and has had a slight increase in weight gain She thinks she is checking readings after meals and these are not any higher than her fasting readings which are mildly increased  HYPERLIPIDEMIA: Has mild increase in nonfasting triglycerides, LDL normal with pravastatin  HYPERTENSION: Overall well-controlled  PLAN:   Continue Trulicity 1.5 mg weekly.    Encourage her to walk more regularly, also given list of indoor  Exercise ideas  Follow-up in 4 months Check urine microalbumin today  There are no Patient Instructions on file for this visit.  Sanyla Summey 01/23/2016, 8:06 AM   Note: This office note was prepared with Dragon voice recognition system technology. Any transcriptional errors that result from  this process are unintentional.  /

## 2016-01-23 NOTE — Addendum Note (Signed)
Addended by: Adline MangoSTONE-ELMORE, Marylou Wages I on: 01/23/2016 08:36 AM   Modules accepted: Orders

## 2016-01-23 NOTE — Patient Instructions (Signed)
Check blood sugars on waking up  2x weekly  Also check blood sugars about 2 hours after a meal and do this after different meals by rotation  Recommended blood sugar levels on waking up is 90-130 and about 2 hours after meal is 130-160  Please bring your blood sugar monitor to each visit, thank you  INDOOR EXERCISE IDEAS   Use the following examples for a creative indoor workout (perform each move for 2-3 minutes):   Warm up. Put on some music that makes you feel like moving, and dance around the living room.  Watch exercise shows on TV and move along with them. There are tons of free cable channels that have daily exercise shows on them for all levels - beginner through advanced.   You can easily find a number of exercise videos but use one that will suit your liking and exercise level; you can do these on your own schedule.  Walk up and down the steps.  Do dumbbell curls and presses (if you don't have weights, use full water bottles).  Do assisted squats, keeping your back on a fitness ball against the wall or using the back of the couch for support.  Shadow box: Lift and lower the left leg; jab with the right arm, then the left; then lift and lower the right leg.  Fence (you don't even need swords). Pretend you're holding a sword in each hand. Create an X pattern standing still, then moving forward and back.  Hop on your exercise bike or treadmill -- or, for something different, use a weighted hula hoop. If you don't have any of those, just go back to dancing.  Do abdominal crunches (hold a weighted ball for added resistance).  Cool down with James Brown's "I Feel Good" -- or whatever tune makes you feel good   

## 2016-01-23 NOTE — Addendum Note (Signed)
Addended by: Adline MangoSTONE-ELMORE, Torian Thoennes I on: 01/23/2016 08:40 AM   Modules accepted: Orders

## 2016-01-26 ENCOUNTER — Other Ambulatory Visit: Payer: Self-pay | Admitting: Internal Medicine

## 2016-02-06 ENCOUNTER — Encounter: Payer: Self-pay | Admitting: Internal Medicine

## 2016-02-07 ENCOUNTER — Other Ambulatory Visit: Payer: Self-pay | Admitting: Emergency Medicine

## 2016-02-07 MED ORDER — LISINOPRIL 40 MG PO TABS: 40.0000 mg | ORAL_TABLET | Freq: Every day | ORAL | 0 refills | Status: DC

## 2016-02-20 ENCOUNTER — Other Ambulatory Visit: Payer: Self-pay | Admitting: Endocrinology

## 2016-03-01 ENCOUNTER — Other Ambulatory Visit: Payer: Self-pay | Admitting: Internal Medicine

## 2016-03-09 ENCOUNTER — Other Ambulatory Visit: Payer: Self-pay | Admitting: Internal Medicine

## 2016-03-09 ENCOUNTER — Ambulatory Visit (INDEPENDENT_AMBULATORY_CARE_PROVIDER_SITE_OTHER): Payer: Managed Care, Other (non HMO) | Admitting: Internal Medicine

## 2016-03-09 ENCOUNTER — Encounter: Payer: Self-pay | Admitting: Internal Medicine

## 2016-03-09 VITALS — BP 142/74 | HR 63 | Temp 98.2°F | Resp 16 | Ht 65.0 in | Wt 182.0 lb

## 2016-03-09 DIAGNOSIS — E1165 Type 2 diabetes mellitus with hyperglycemia: Secondary | ICD-10-CM

## 2016-03-09 DIAGNOSIS — I10 Essential (primary) hypertension: Secondary | ICD-10-CM | POA: Diagnosis not present

## 2016-03-09 DIAGNOSIS — F419 Anxiety disorder, unspecified: Secondary | ICD-10-CM

## 2016-03-09 DIAGNOSIS — Z0000 Encounter for general adult medical examination without abnormal findings: Secondary | ICD-10-CM | POA: Diagnosis not present

## 2016-03-09 DIAGNOSIS — E785 Hyperlipidemia, unspecified: Secondary | ICD-10-CM

## 2016-03-09 DIAGNOSIS — G479 Sleep disorder, unspecified: Secondary | ICD-10-CM

## 2016-03-09 MED ORDER — TRAZODONE HCL 50 MG PO TABS: 50.0000 mg | ORAL_TABLET | Freq: Every evening | ORAL | 3 refills | Status: DC | PRN

## 2016-03-09 NOTE — Patient Instructions (Signed)
Test(s) ordered today. Your results will be released to Rosine (or called to you) after review, usually within 72hours after test completion. If any changes need to be made, you will be notified at that same time.  All other Health Maintenance issues reviewed.   All recommended immunizations and age-appropriate screenings are up-to-date or discussed.  No immunizations administered today.   Medications reviewed and updated.  Changes include trying trazodone for sleep.  Your prescription(s) have been submitted to your pharmacy. Please take as directed and contact our office if you believe you are having problem(s) with the medication(s).  Please followup in one year  Health Maintenance, Female Adopting a healthy lifestyle and getting preventive care can go a long way to promote health and wellness. Talk with your health care provider about what schedule of regular examinations is right for you. This is a good chance for you to check in with your provider about disease prevention and staying healthy. In between checkups, there are plenty of things you can do on your own. Experts have done a lot of research about which lifestyle changes and preventive measures are most likely to keep you healthy. Ask your health care provider for more information. WEIGHT AND DIET  Eat a healthy diet  Be sure to include plenty of vegetables, fruits, low-fat dairy products, and lean protein.  Do not eat a lot of foods high in solid fats, added sugars, or salt.  Get regular exercise. This is one of the most important things you can do for your health.  Most adults should exercise for at least 150 minutes each week. The exercise should increase your heart rate and make you sweat (moderate-intensity exercise).  Most adults should also do strengthening exercises at least twice a week. This is in addition to the moderate-intensity exercise.  Maintain a healthy weight  Body mass index (BMI) is a measurement that  can be used to identify possible weight problems. It estimates body fat based on height and weight. Your health care provider can help determine your BMI and help you achieve or maintain a healthy weight.  For females 18 years of age and older:   A BMI below 18.5 is considered underweight.  A BMI of 18.5 to 24.9 is normal.  A BMI of 25 to 29.9 is considered overweight.  A BMI of 30 and above is considered obese.  Watch levels of cholesterol and blood lipids  You should start having your blood tested for lipids and cholesterol at 61 years of age, then have this test every 5 years.  You may need to have your cholesterol levels checked more often if:  Your lipid or cholesterol levels are high.  You are older than 61 years of age.  You are at high risk for heart disease.  CANCER SCREENING   Lung Cancer  Lung cancer screening is recommended for adults 33-79 years old who are at high risk for lung cancer because of a history of smoking.  A yearly low-dose CT scan of the lungs is recommended for people who:  Currently smoke.  Have quit within the past 15 years.  Have at least a 30-pack-year history of smoking. A pack year is smoking an average of one pack of cigarettes a day for 1 year.  Yearly screening should continue until it has been 15 years since you quit.  Yearly screening should stop if you develop a health problem that would prevent you from having lung cancer treatment.  Breast Cancer  Practice  breast self-awareness. This means understanding how your breasts normally appear and feel.  It also means doing regular breast self-exams. Let your health care provider know about any changes, no matter how small.  If you are in your 20s or 30s, you should have a clinical breast exam (CBE) by a health care provider every 1-3 years as part of a regular health exam.  If you are 61 or older, have a CBE every year. Also consider having a breast X-ray (mammogram) every  year.  If you have a family history of breast cancer, talk to your health care provider about genetic screening.  If you are at high risk for breast cancer, talk to your health care provider about having an MRI and a mammogram every year.  Breast cancer gene (BRCA) assessment is recommended for women who have family members with BRCA-related cancers. BRCA-related cancers include:  Breast.  Ovarian.  Tubal.  Peritoneal cancers.  Results of the assessment will determine the need for genetic counseling and BRCA1 and BRCA2 testing. Cervical Cancer Your health care provider may recommend that you be screened regularly for cancer of the pelvic organs (ovaries, uterus, and vagina). This screening involves a pelvic examination, including checking for microscopic changes to the surface of your cervix (Pap test). You may be encouraged to have this screening done every 3 years, beginning at age 56.  For women ages 68-65, health care providers may recommend pelvic exams and Pap testing every 3 years, or they may recommend the Pap and pelvic exam, combined with testing for human papilloma virus (HPV), every 5 years. Some types of HPV increase your risk of cervical cancer. Testing for HPV may also be done on women of any age with unclear Pap test results.  Other health care providers may not recommend any screening for nonpregnant women who are considered low risk for pelvic cancer and who do not have symptoms. Ask your health care provider if a screening pelvic exam is right for you.  If you have had past treatment for cervical cancer or a condition that could lead to cancer, you need Pap tests and screening for cancer for at least 20 years after your treatment. If Pap tests have been discontinued, your risk factors (such as having a new sexual partner) need to be reassessed to determine if screening should resume. Some women have medical problems that increase the chance of getting cervical cancer. In  these cases, your health care provider may recommend more frequent screening and Pap tests. Colorectal Cancer  This type of cancer can be detected and often prevented.  Routine colorectal cancer screening usually begins at 61 years of age and continues through 61 years of age.  Your health care provider may recommend screening at an earlier age if you have risk factors for colon cancer.  Your health care provider may also recommend using home test kits to check for hidden blood in the stool.  A small camera at the end of a tube can be used to examine your colon directly (sigmoidoscopy or colonoscopy). This is done to check for the earliest forms of colorectal cancer.  Routine screening usually begins at age 47.  Direct examination of the colon should be repeated every 5-10 years through 61 years of age. However, you may need to be screened more often if early forms of precancerous polyps or small growths are found. Skin Cancer  Check your skin from head to toe regularly.  Tell your health care provider about any new  moles or changes in moles, especially if there is a change in a mole's shape or color.  Also tell your health care provider if you have a mole that is larger than the size of a pencil eraser.  Always use sunscreen. Apply sunscreen liberally and repeatedly throughout the day.  Protect yourself by wearing long sleeves, pants, a wide-brimmed hat, and sunglasses whenever you are outside. HEART DISEASE, DIABETES, AND HIGH BLOOD PRESSURE   High blood pressure causes heart disease and increases the risk of stroke. High blood pressure is more likely to develop in:  People who have blood pressure in the high end of the normal range (130-139/85-89 mm Hg).  People who are overweight or obese.  People who are African American.  If you are 26-14 years of age, have your blood pressure checked every 3-5 years. If you are 75 years of age or older, have your blood pressure checked  every year. You should have your blood pressure measured twice--once when you are at a hospital or clinic, and once when you are not at a hospital or clinic. Record the average of the two measurements. To check your blood pressure when you are not at a hospital or clinic, you can use:  An automated blood pressure machine at a pharmacy.  A home blood pressure monitor.  If you are between 52 years and 44 years old, ask your health care provider if you should take aspirin to prevent strokes.  Have regular diabetes screenings. This involves taking a blood sample to check your fasting blood sugar level.  If you are at a normal weight and have a low risk for diabetes, have this test once every three years after 61 years of age.  If you are overweight and have a high risk for diabetes, consider being tested at a younger age or more often. PREVENTING INFECTION  Hepatitis B  If you have a higher risk for hepatitis B, you should be screened for this virus. You are considered at high risk for hepatitis B if:  You were born in a country where hepatitis B is common. Ask your health care provider which countries are considered high risk.  Your parents were born in a high-risk country, and you have not been immunized against hepatitis B (hepatitis B vaccine).  You have HIV or AIDS.  You use needles to inject street drugs.  You live with someone who has hepatitis B.  You have had sex with someone who has hepatitis B.  You get hemodialysis treatment.  You take certain medicines for conditions, including cancer, organ transplantation, and autoimmune conditions. Hepatitis C  Blood testing is recommended for:  Everyone born from [birth year] through 1966.  Anyone with known risk factors for hepatitis C. Sexually transmitted infections (STIs)  You should be screened for sexually transmitted infections (STIs) including gonorrhea and chlamydia if:  You are sexually active and are younger than 61 years  of age.  You are older than 61 years of age and your health care provider tells you that you are at risk for this type of infection.  Your sexual activity has changed since you were last screened and you are at an increased risk for chlamydia or gonorrhea. Ask your health care provider if you are at risk.  If you do not have HIV, but are at risk, it may be recommended that you take a prescription medicine daily to prevent HIV infection. This is called pre-exposure prophylaxis (PrEP). You are considered at risk if:  You are sexually active and do not regularly use condoms or know the HIV status of your partner(s).  You take drugs by injection.  You are sexually active with a partner who has HIV. Talk with your health care provider about whether you are at high risk of being infected with HIV. If you choose to begin PrEP, you should first be tested for HIV. You should then be tested every 3 months for as long as you are taking PrEP.  PREGNANCY   If you are premenopausal and you may become pregnant, ask your health care provider about preconception counseling.  If you may become pregnant, take 400 to 800 micrograms (mcg) of folic acid every day.  If you want to prevent pregnancy, talk to your health care provider about birth control (contraception). OSTEOPOROSIS AND MENOPAUSE   Osteoporosis is a disease in which the bones lose minerals and strength with aging. This can result in serious bone fractures. Your risk for osteoporosis can be identified using a bone density scan.  If you are 68 years of age or older, or if you are at risk for osteoporosis and fractures, ask your health care provider if you should be screened.  Ask your health care provider whether you should take a calcium or vitamin D supplement to lower your risk for osteoporosis.  Menopause may have certain physical symptoms and risks.  Hormone replacement therapy may reduce some of these symptoms and risks. Talk to your  health care provider about whether hormone replacement therapy is right for you.  HOME CARE INSTRUCTIONS   Schedule regular health, dental, and eye exams.  Stay current with your immunizations.   Do not use any tobacco products including cigarettes, chewing tobacco, or electronic cigarettes.  If you are pregnant, do not drink alcohol.  If you are breastfeeding, limit how much and how often you drink alcohol.  Limit alcohol intake to no more than 1 drink per day for nonpregnant women. One drink equals 12 ounces of beer, 5 ounces of wine, or 1 ounces of hard liquor.  Do not use street drugs.  Do not share needles.  Ask your health care provider for help if you need support or information about quitting drugs.  Tell your health care provider if you often feel depressed.  Tell your health care provider if you have ever been abused or do not feel safe at home.   This information is not intended to replace advice given to you by your health care provider. Make sure you discuss any questions you have with your health care provider.   Document Released: 10/30/2010 Document Revised: 05/07/2014 Document Reviewed: 03/18/2013 Elsevier Interactive Patient Education [employer].

## 2016-03-09 NOTE — Assessment & Plan Note (Signed)
Lipid panel controlled Continue statin 

## 2016-03-09 NOTE — Assessment & Plan Note (Signed)
Trial of trazodone  Titrate dose if needed

## 2016-03-09 NOTE — Progress Notes (Signed)
Pre visit review using our clinic review tool, if applicable. No additional management support is needed unless otherwise documented below in the visit note. 

## 2016-03-09 NOTE — Assessment & Plan Note (Signed)
Slightly elevated today, but typically well controlled Continue current medications at current doses Blood work reviewed - cmp normal

## 2016-03-09 NOTE — Assessment & Plan Note (Signed)
management per Dr Kumar 

## 2016-03-09 NOTE — Progress Notes (Signed)
Subjective:    Patient ID: Terry Hull, female    DOB: [birth year], 60 y.o.   MRN: [id number]  HPI She is here to establish with a new pcp.  She is here for a physical exam.   She has not been exercising.  She knows she needs to get back into it.    She denies changes in her history since she was here last.   She has not been sleeping well and feels it may be related to stress.  She plans on retiring next year.    Medications and allergies reviewed with patient and updated if appropriate.  Patient Active Problem List   Diagnosis Date Noted  . Type 2 diabetes mellitus with hyperglycemia, without long-term current use of insulin (HCC) 03/07/2015  . Anxiety state, unspecified 05/29/2010  . HYPERTRIGLYCERIDEMIA 02/28/2010  . Essential hypertension 02/28/2010  . Allergic rhinitis 02/28/2010  . Arthritis 02/28/2010  . Type 2 diabetes mellitus, uncontrolled (HCC) 02/23/2010  . SLEEP APNEA 02/08/2010  . ATHEROSLERO NATV ART EXTREM W/INTERMIT CLAUDICAT 02/07/2010  . CHEST PAIN 02/04/2010    Current Outpatient Prescriptions on File Prior to Visit  Medication Sig Dispense Refill  . albuterol (PROVENTIL HFA;VENTOLIN HFA) 108 (90 BASE) MCG/ACT inhaler Inhale 2 puffs into the lungs every 6 (six) hours as needed for wheezing or shortness of breath. 1 Inhaler 2  . amLODipine (NORVASC) 5 MG tablet Take 1 tablet (5 mg total) by mouth daily. Keep Nov appt w/new PCP for future refills 90 tablet 0  . carvedilol (COREG) 6.25 MG tablet Take 1 tablet (6.25 mg total) by mouth 2 (two) times daily with a meal. Yearly physical is due w/labs must MD for refills 60 tablet 0  . citalopram (CELEXA) 20 MG tablet Take 1 tablet (20 mg total) by mouth daily. Yearly physical w/labs due in Nov must see MD for refills 90 tablet 0  . diclofenac (VOLTAREN) 75 MG EC tablet TAKE 1 TABLET(75 MG) BY MOUTH TWICE DAILY AS NEEDED FOR MILD PAIN OR MODERATE PAIN 60 tablet 5  . fluticasone (FLONASE) 50 MCG/ACT nasal spray  Place 1 spray into both nostrils daily. 16 g 2  . hydrochlorothiazide (MICROZIDE) 12.5 MG capsule Take 1 capsule (12.5 mg total) by mouth daily. Keep Nov appt w/new MD for future refills 90 capsule 0  . Insulin Pen Needle 31G X 5 MM MISC Use to inject insulin twice per day as directed with rx for bByetta. DX E11.09 200 each 3  . lisinopril (PRINIVIL,ZESTRIL) 40 MG tablet Take 1 tablet (40 mg total) by mouth daily. 90 tablet 0  . metFORMIN (GLUCOPHAGE-XR) 500 MG 24 hr tablet TAKE 4 TABLETS(2000 MG) BY MOUTH DAILY WITH SUPPER 120 tablet 3  . ONE TOUCH LANCETS MISC Use to help check blood sugar twice a day 60 each 5  . ONE TOUCH ULTRA TEST test strip USE TO CHECK BLOOD SUGAR BEFORE BREAKFAST AND BEDTIME 100 each 0  . pravastatin (PRAVACHOL) 20 MG tablet Take 1 tablet (20 mg total) by mouth daily. Yearly physical w/labs due in Nov must see MD for refills 90 tablet 0  . TRULICITY 1.5 MG/0.5ML SOPN INJECT UNDER THE SKIN WEEKLY AS DIRECTED 2 mL 2   No current facility-administered medications on file prior to visit.     Past Medical History:  Diagnosis Date  . Abnormal electrocardiogram    consistent with LVH hypertrophy  . ALLERGIC RHINITIS   . ARTHRITIS   . DIABETES MELLITUS, TYPE II  follows with endo  . HYPERTENSION   . HYPERTRIGLYCERIDEMIA   . SLEEP APNEA     Past Surgical History:  Procedure Laterality Date  . ABDOMINAL HYSTERECTOMY    . HAND TENDON SURGERY    . NASAL POLYP SURGERY      Social History   Social History  . Marital status: Married    Spouse name: N/A  . Number of children: 1  . Years of education: N/A   Occupational History  . [employer]   Social History Main Topics  . Smoking status: Former Smoker    Packs/day: 1.00    Quit date: 12/29/2012  . Smokeless tobacco: None  . Alcohol use Yes  . Drug use: No  . Sexual activity: Not Asked   Other Topics Concern  . None   Social History Narrative   She has 1 child  and 3 grandchildren   She works at [employer]          Family History  Problem Relation Age of Onset  . Cancer Mother     Ovarian Cancer  . Hyperlipidemia Mother   . Hypertension Mother   . Diabetes Other     1st degree relative    Review of Systems  Constitutional: Negative for chills and fever.  HENT: Positive for postnasal drip.   Eyes: Negative for visual disturbance (up to date with eye exams).  Respiratory: Negative for cough, shortness of breath and wheezing.   Cardiovascular: Negative for chest pain, palpitations and leg swelling.  Gastrointestinal: Negative for abdominal pain, blood in stool, constipation, diarrhea and nausea.       Occ GERD  Genitourinary: Negative for dysuria and hematuria.  Musculoskeletal: Positive for arthralgias.  Skin: Negative for color change.  Neurological: Negative for light-headedness and headaches.  Psychiatric/Behavioral: Negative for dysphoric mood. The patient is not nervous/anxious.        Objective:   Vitals:   03/09/16 1543  BP: (!) 142/74  Pulse: 63  Resp: 16  Temp: 98.2 F (36.8 C)   Filed Weights   03/09/16 1543  Weight: 182 lb (82.6 kg)   Body mass index is 30.29 kg/m.   Physical Exam Constitutional: She appears well-developed and well-nourished. No distress.  HENT:  Head: Normocephalic and atraumatic.  Right Ear: External ear normal. Normal ear canal and TM Left Ear: External ear normal.  Normal ear canal and TM Mouth/Throat: Oropharynx is clear and moist.  Eyes: Conjunctivae and EOM are normal.  Neck: Neck supple. No tracheal deviation present. No thyromegaly present.  No carotid bruit  Cardiovascular: Normal rate, regular rhythm and normal heart sounds.   No murmur heard.  No edema. Pulmonary/Chest: Effort normal and breath sounds normal. No respiratory distress. She has no wheezes. She has no rales.  Breast: deferred to Gyn Abdominal: Soft. She exhibits no distension. There is no  tenderness.  Lymphadenopathy: She has no cervical adenopathy.  Skin: Skin is warm and dry. She is not diaphoretic.  Psychiatric: She has a normal mood and affect. Her behavior is normal.         Assessment & Plan:   Physical exam: Screening blood work reviewed - ordered by Dr Lucianne Muss Immunizations discussed shingles vaccine Colonoscopy  Up to date  Mammogram  Up to date  Gyn   Up to date Eye exams - will have it in november Exercise - stressed regular exercise Weight - work on weight loss Skin - no concerns Substance abuse  --  none  See Problem List for Assessment and Plan of chronic medical problems.

## 2016-03-09 NOTE — Assessment & Plan Note (Signed)
Controlled, stable Continue current dose of medication  

## 2016-04-07 ENCOUNTER — Other Ambulatory Visit: Payer: Self-pay | Admitting: Internal Medicine

## 2016-04-26 ENCOUNTER — Other Ambulatory Visit: Payer: Self-pay | Admitting: Endocrinology

## 2016-04-26 ENCOUNTER — Other Ambulatory Visit: Payer: Self-pay | Admitting: Internal Medicine

## 2016-05-04 ENCOUNTER — Other Ambulatory Visit: Payer: Self-pay | Admitting: Internal Medicine

## 2016-05-14 ENCOUNTER — Encounter: Payer: Self-pay | Admitting: Internal Medicine

## 2016-05-14 ENCOUNTER — Ambulatory Visit (INDEPENDENT_AMBULATORY_CARE_PROVIDER_SITE_OTHER): Payer: Managed Care, Other (non HMO) | Admitting: Internal Medicine

## 2016-05-14 VITALS — BP 142/74 | HR 67 | Temp 99.0°F | Resp 16 | Wt 177.0 lb

## 2016-05-14 DIAGNOSIS — J0100 Acute maxillary sinusitis, unspecified: Secondary | ICD-10-CM | POA: Diagnosis not present

## 2016-05-14 DIAGNOSIS — H60391 Other infective otitis externa, right ear: Secondary | ICD-10-CM | POA: Diagnosis not present

## 2016-05-14 MED ORDER — AMOXICILLIN-POT CLAVULANATE 875-125 MG PO TABS: 1.0000 | ORAL_TABLET | Freq: Two times a day (BID) | ORAL | 0 refills | Status: DC

## 2016-05-14 MED ORDER — NEOMYCIN-POLYMYXIN-HC 3.5-10000-1 OT SOLN: 4.0000 [drp] | Freq: Four times a day (QID) | OTIC | 0 refills | Status: DC

## 2016-05-14 MED ORDER — FLUTICASONE PROPIONATE 50 MCG/ACT NA SUSP: 1.0000 | Freq: Every day | NASAL | 2 refills | Status: AC

## 2016-05-14 NOTE — Progress Notes (Signed)
Subjective:    Patient ID: Terry Hull, female    DOB: [birth year], 61 y.o.   MRN: [id number]  HPI She is here for an acute visit for cold symptoms.   Her symptoms started two days ago.  She woke up in the middle of the night with severe pain In her right ear.  Her husband got her ear drops (hylane pain ear drops) and it helped.  Her pain recurred and has been persistent since then.  She feels like there is water in the ear, but denies any discharge. Her hearing is unchanged. She has had fevers, nasal congestion, sinus pain, sore throat and mild lightheadedness. She denies any headaches, cough, wheeze or shortness of breath.  She is flying out of the country in 4 days.   Medications and allergies reviewed with patient and updated if appropriate.  Patient Active Problem List   Diagnosis Date Noted  . Sleep difficulties 03/09/2016  . Hyperlipidemia 03/09/2016  . Type 2 diabetes mellitus with hyperglycemia, without long-term current use of insulin (HCC) 03/07/2015  . Anxiety 05/29/2010  . Essential hypertension 02/28/2010  . Allergic rhinitis 02/28/2010  . Arthritis 02/28/2010  . SLEEP APNEA 02/08/2010  . ATHEROSLERO NATV ART EXTREM W/INTERMIT CLAUDICAT 02/07/2010    Current Outpatient Prescriptions on File Prior to Visit  Medication Sig Dispense Refill  . albuterol (PROVENTIL HFA;VENTOLIN HFA) 108 (90 BASE) MCG/ACT inhaler Inhale 2 puffs into the lungs every 6 (six) hours as needed for wheezing or shortness of breath. 1 Inhaler 2  . amLODipine (NORVASC) 5 MG tablet Take 1 tablet (5 mg total) by mouth daily. 90 tablet 3  . carvedilol (COREG) 6.25 MG tablet TAKE 1 TABLET BY MOUTH TWICE DAILY WITH MEALS 60 tablet 5  . citalopram (CELEXA) 20 MG tablet TAKE 1 TABLET BY MOUTH DAILY 90 tablet 1  . diclofenac (VOLTAREN) 75 MG EC tablet TAKE 1 TABLET(75 MG) BY MOUTH TWICE DAILY AS NEEDED FOR MILD PAIN OR MODERATE PAIN 60 tablet 5  . fluticasone (FLONASE) 50 MCG/ACT nasal spray Place 1  spray into both nostrils daily. 16 g 2  . hydrochlorothiazide (MICROZIDE) 12.5 MG capsule TAKE 1 CAPSULE(12.5 MG) BY MOUTH DAILY 90 capsule 3  . Insulin Pen Needle 31G X 5 MM MISC Use to inject insulin twice per day as directed with rx for bByetta. DX E11.09 200 each 3  . lisinopril (PRINIVIL,ZESTRIL) 40 MG tablet TAKE 1 TABLET(40 MG) BY MOUTH DAILY 90 tablet 3  . metFORMIN (GLUCOPHAGE-XR) 500 MG 24 hr tablet TAKE 4 TABLETS(2000 MG) BY MOUTH DAILY WITH SUPPER 120 tablet 3  . ONE TOUCH LANCETS MISC Use to help check blood sugar twice a day 60 each 5  . ONE TOUCH ULTRA TEST test strip USE TO CHECK BLOOD SUGAR BEFORE BREAKFAST AND BEDTIME 100 each 0  . pravastatin (PRAVACHOL) 20 MG tablet TAKE 1 TABLET BY MOUTH DAILY 90 tablet 3  . traZODone (DESYREL) 50 MG tablet Take 1-2 tablets (50-100 mg total) by mouth at bedtime as needed for sleep. 60 tablet 3  . TRULICITY 1.5 MG/0.5ML SOPN INJECT UNDER THE SKIN WEEKLY AS DIRECTED 2 mL 0   No current facility-administered medications on file prior to visit.     Past Medical History:  Diagnosis Date  . Abnormal electrocardiogram    consistent with LVH hypertrophy  . ALLERGIC RHINITIS   . ARTHRITIS   . DIABETES MELLITUS, TYPE II    follows with endo  . HYPERTENSION   . HYPERTRIGLYCERIDEMIA   .  SLEEP APNEA     Past Surgical History:  Procedure Laterality Date  . ABDOMINAL HYSTERECTOMY    . HAND TENDON SURGERY    . NASAL POLYP SURGERY      Social History   Social History  . Marital status: Married    Spouse name: N/A  . Number of children: 1  . Years of education: N/A   Occupational History  . [employer]   Social History Main Topics  . Smoking status: Former Smoker    Packs/day: 1.00    Quit date: 12/29/2012  . Smokeless tobacco: Not on file  . Alcohol use Yes  . Drug use: No  . Sexual activity: Not on file   Other Topics Concern  . Not on file   Social History Narrative   She has 1 child  and 3 grandchildren   She works at [employer]          Family History  Problem Relation Age of Onset  . Cancer Mother     Ovarian Cancer  . Hyperlipidemia Mother   . Hypertension Mother   . Diabetes Other     1st degree relative    Review of Systems  Constitutional: Positive for fever.  HENT: Positive for congestion, ear pain, sinus pain and sore throat. Negative for ear discharge.   Respiratory: Negative for cough, shortness of breath and wheezing.   Neurological: Positive for light-headedness. Negative for headaches.       Objective:   Vitals:   05/14/16 1615  BP: (!) 142/74  Pulse: 67  Resp: 16  Temp: 99 F (37.2 C)   Filed Weights   05/14/16 1615  Weight: 177 lb (80.3 kg)   Body mass index is 29.45 kg/m.  Wt Readings from Last 3 Encounters:  05/14/16 177 lb (80.3 kg)  03/09/16 182 lb (82.6 kg)  01/23/16 175 lb 12.8 oz (79.7 kg)     Physical Exam GENERAL APPEARANCE: Appears stated age, well appearing, NAD EYES: conjunctiva clear, no icterus HEENT: Left ear canal and tympanic membrane normal. Right ear canal swollen and erythematous, partially visualized tympanic membrane mildly erythematous, oropharynx with mild erythema, no thyromegaly, trachea midline, no cervical or supraclavicular lymphadenopathy LUNGS: Clear to auscultation without wheeze or crackles, unlabored breathing, good air entry bilaterally HEART: Normal S1,S2 without murmurs EXTREMITIES: Without clubbing, cyanosis, or edema        Assessment & Plan:   See Problem List for Assessment and Plan of chronic medical problems.

## 2016-05-14 NOTE — Patient Instructions (Signed)

## 2016-05-14 NOTE — Assessment & Plan Note (Signed)
Acute sinus infection-likely bacterial Start Augmentin 3 times a day 10 days Start Flonase nasal spray daily Rest, fluids Call if no improvement over the next few days

## 2016-05-14 NOTE — Progress Notes (Signed)
Pre visit review using our clinic review tool, if applicable. No additional management support is needed unless otherwise documented below in the visit note. 

## 2016-05-14 NOTE — Assessment & Plan Note (Signed)
Right otitis externa and likely otitis media Started on Augmentin orally Antibiotic eardrops Start Flonase daily and continue throughout trip if she is still having symptoms-definitely take each morning that she flies Call if no improvement over the next few days

## 2016-05-24 ENCOUNTER — Other Ambulatory Visit: Payer: Self-pay | Admitting: Endocrinology

## 2016-06-05 ENCOUNTER — Other Ambulatory Visit (INDEPENDENT_AMBULATORY_CARE_PROVIDER_SITE_OTHER): Payer: Managed Care, Other (non HMO)

## 2016-06-05 DIAGNOSIS — E119 Type 2 diabetes mellitus without complications: Secondary | ICD-10-CM

## 2016-06-05 LAB — BASIC METABOLIC PANEL
BUN: 18 mg/dL (ref 6–23)
CHLORIDE: 101 meq/L (ref 96–112)
CO2: 32 mEq/L (ref 19–32)
Calcium: 10.3 mg/dL (ref 8.4–10.5)
Creatinine, Ser: 0.88 mg/dL (ref 0.40–1.20)
GFR: 83.91 mL/min (ref >60.00)
Glucose, Bld: 148 mg/dL — ABNORMAL HIGH (ref 70–99)
POTASSIUM: 4 meq/L (ref 3.5–5.1)
Sodium: 139 mEq/L (ref 135–145)

## 2016-06-08 ENCOUNTER — Ambulatory Visit (HOSPITAL_COMMUNITY)
Admission: EM | Admit: 2016-06-08 | Discharge: 2016-06-08 | Disposition: A | Discharge status: Home / Self Care | Payer: Managed Care, Other (non HMO) | Attending: Family Medicine | Admitting: Family Medicine

## 2016-06-08 ENCOUNTER — Encounter (HOSPITAL_COMMUNITY): Payer: Self-pay | Admitting: Emergency Medicine

## 2016-06-08 ENCOUNTER — Ambulatory Visit: Payer: Managed Care, Other (non HMO) | Admitting: Endocrinology

## 2016-06-08 ENCOUNTER — Ambulatory Visit: Payer: Managed Care, Other (non HMO) | Admitting: Family

## 2016-06-08 DIAGNOSIS — S46911A Strain of unspecified muscle, fascia and tendon at shoulder and upper arm level, right arm, initial encounter: Secondary | ICD-10-CM

## 2016-06-08 MED ORDER — PREDNISONE 20 MG PO TABS: ORAL_TABLET | ORAL | 0 refills | Status: DC

## 2016-06-08 MED ORDER — OXYCODONE-ACETAMINOPHEN 5-325 MG PO TABS: 2.0000 | ORAL_TABLET | ORAL | 0 refills | Status: DC | PRN

## 2016-06-08 NOTE — ED Provider Notes (Signed)
[facility]    CSN: [id number] Arrival date & time: 06/08/16  1012     History   Chief Complaint Chief Complaint  Patient presents with  . Shoulder Pain    HPI Terry Hull is a 61 y.o. female.   PT reports a shooting pain from right shoulder blade down right arm for 1 week. No known injury. Right paraspinal thoracic. She works as a Education administrator. She's done no heavy lifting. There's been no shortness of breath. No diaphoresis or cough.  The pain is slightly worse when she raises her arm, but much worse when she presses on the right interscapular area. She's had no fever or shortness of breath.      Past Medical History:  Diagnosis Date  . Abnormal electrocardiogram    consistent with LVH hypertrophy  . ALLERGIC RHINITIS   . ARTHRITIS   . DIABETES MELLITUS, TYPE II    follows with endo  . HYPERTENSION   . HYPERTRIGLYCERIDEMIA   . SLEEP APNEA     Patient Active Problem List   Diagnosis Date Noted  . Infective otitis externa of right ear 05/14/2016  . Acute non-recurrent maxillary sinusitis 05/14/2016  . Sleep difficulties 03/09/2016  . Hyperlipidemia 03/09/2016  . Type 2 diabetes mellitus with hyperglycemia, without long-term current use of insulin (HCC) 03/07/2015  . Anxiety 05/29/2010  . Essential hypertension 02/28/2010  . Allergic rhinitis 02/28/2010  . Arthritis 02/28/2010  . SLEEP APNEA 02/08/2010  . ATHEROSLERO NATV ART EXTREM W/INTERMIT CLAUDICAT 02/07/2010    Past Surgical History:  Procedure Laterality Date  . ABDOMINAL HYSTERECTOMY    . HAND TENDON SURGERY    . NASAL POLYP SURGERY      OB History    No data available       Home Medications    Prior to Admission medications   Medication Sig Start Date End Date Taking? Authorizing Provider  albuterol (PROVENTIL HFA;VENTOLIN HFA) 108 (90 BASE) MCG/ACT inhaler Inhale 2 puffs into the lungs every 6 (six) hours as needed for wheezing or shortness of breath. 10/07/13   Rodolph Bong, MD  amLODipine (NORVASC) 5 MG tablet Take 1 tablet (5 mg total) by mouth daily. 03/09/16   Pincus Sanes, MD  amoxicillin-clavulanate (AUGMENTIN) 875-125 MG tablet Take 1 tablet by mouth 2 (two) times daily. 05/14/16   Pincus Sanes, MD  carvedilol (COREG) 6.25 MG tablet TAKE 1 TABLET BY MOUTH TWICE DAILY WITH MEALS 04/09/16   Pincus Sanes, MD  citalopram (CELEXA) 20 MG tablet TAKE 1 TABLET BY MOUTH DAILY 04/26/16   Pincus Sanes, MD  diclofenac (VOLTAREN) 75 MG EC tablet TAKE 1 TABLET(75 MG) BY MOUTH TWICE DAILY AS NEEDED FOR MILD PAIN OR MODERATE PAIN 11/24/15   Pincus Sanes, MD  fluticasone (FLONASE) 50 MCG/ACT nasal spray Place 1 spray into both nostrils daily. 05/14/16   Pincus Sanes, MD  hydrochlorothiazide (MICROZIDE) 12.5 MG capsule TAKE 1 CAPSULE(12.5 MG) BY MOUTH DAILY 03/09/16   Pincus Sanes, MD  Insulin Pen Needle 31G X 5 MM MISC Use to inject insulin twice per day as directed with rx for bByetta. DX E11.09 03/08/15   Newt Lukes, MD  lisinopril (PRINIVIL,ZESTRIL) 40 MG tablet TAKE 1 TABLET(40 MG) BY MOUTH DAILY 05/04/16   Pincus Sanes, MD  metFORMIN (GLUCOPHAGE-XR) 500 MG 24 hr tablet TAKE 4 TABLETS(2000 MG) BY MOUTH DAILY WITH SUPPER 02/20/16   Reather Littler, MD  neomycin-polymyxin-hydrocortisone (CORTISPORIN) otic solution Place  4 drops into the right ear 4 (four) times daily. 05/14/16   Pincus Sanes, MD  ONE TOUCH LANCETS MISC Use to help check blood sugar twice a day 11/11/14   Veryl Speak, FNP  ONE [facility] ULTRA TEST test strip USE TO CHECK BLOOD SUGAR BEFORE BREAKFAST AND BEDTIME 07/01/15   Veryl Speak, FNP  oxyCODONE-acetaminophen (PERCOCET/ROXICET) 5-325 MG tablet Take 2 tablets by mouth every 4 (four) hours as needed for severe pain. 06/08/16   Elvina Sidle, MD  pravastatin (PRAVACHOL) 20 MG tablet TAKE 1 TABLET BY MOUTH DAILY 04/26/16   Pincus Sanes, MD  predniSONE (DELTASONE) 20 MG tablet Two daily with food 06/08/16   Elvina Sidle, MD  traZODone (DESYREL)  50 MG tablet Take 1-2 tablets (50-100 mg total) by mouth at bedtime as needed for sleep. 03/09/16   Pincus Sanes, MD  TRULICITY 1.5 MG/0.5ML SOPN INJECT UNDER THE SKIN WEEKLY AS DIRECTED 05/24/16   Reather Littler, MD    Family History Family History  Problem Relation Age of Onset  . Cancer Mother     Ovarian Cancer  . Hyperlipidemia Mother   . Hypertension Mother   . Diabetes Other     1st degree relative    Social History Social History  Substance Use Topics  . Smoking status: Former Smoker    Packs/day: 1.00    Quit date: 12/29/2012  . Smokeless tobacco: Never Used  . Alcohol use Yes     Comment: 4 per week     Allergies   Patient has no known allergies.   Review of Systems Review of Systems  Constitutional: Negative.   HENT: Negative.   Respiratory: Negative for cough, choking, chest tightness and shortness of breath.   Cardiovascular: Positive for chest pain.  Gastrointestinal: Negative.   Genitourinary: Negative.   Musculoskeletal: Positive for back pain.  Neurological: Negative.   Psychiatric/Behavioral: Negative.      Physical Exam Triage Vital Signs ED Triage Vitals  Enc Vitals Group     BP 06/08/16 1102 (!) 206/94     Pulse Rate 06/08/16 1102 65     Resp 06/08/16 1102 16     Temp 06/08/16 1102 98.8 F (37.1 C)     Temp Source 06/08/16 1102 Oral     SpO2 06/08/16 1102 96 %     Weight 06/08/16 1102 177 lb (80.3 kg)     Height 06/08/16 1102 5\' 5"  (1.651 m)     Head Circumference --      Peak Flow --      Pain Score 06/08/16 1103 10     Pain Loc --      Pain Edu? --      Excl. in GC? --    No data found.   Updated Vital Signs BP (!) 206/94   Pulse 65   Temp 98.8 F (37.1 C) (Oral)   Resp 16   Ht 5\' 5"  (1.651 m)   Wt 177 lb (80.3 kg)   SpO2 96%   BMI 29.45 kg/m    Physical Exam  Constitutional: She is oriented to person, place, and time. She appears well-developed and well-nourished.  HENT:  Head: Normocephalic.  Right Ear: External  ear normal.  Left Ear: External ear normal.  Mouth/Throat: Oropharynx is clear and moist.  Eyes: Conjunctivae are normal. Pupils are equal, round, and reactive to light.  Neck: Normal range of motion. Neck supple.  Cardiovascular: Normal rate, regular rhythm and normal heart sounds.  Pulmonary/Chest: Effort normal and breath sounds normal.  Musculoskeletal: Normal range of motion.  Neurological: She is alert and oriented to person, place, and time.  Skin: Skin is warm and dry.  Nursing note and vitals reviewed.    UC Treatments / Results  Labs (all labs ordered are listed, but only abnormal results are displayed) Labs Reviewed - No data to display  EKG  EKG Interpretation None       Radiology No results found.  Procedures Procedures (including critical care time)  Medications Ordered in UC Medications - No data to display   Initial Impression / Assessment and Plan / UC Course  I have reviewed the triage vital signs and the nursing notes.  Pertinent labs & imaging results that were available during my care of the patient were reviewed by me and considered in my medical decision making (see chart for details).     Final Clinical Impressions(s) / UC Diagnoses   Final diagnoses:  Strain of right shoulder, initial encounter    New Prescriptions New Prescriptions   OXYCODONE-ACETAMINOPHEN (PERCOCET/ROXICET) 5-325 MG TABLET    Take 2 tablets by mouth every 4 (four) hours as needed for severe pain.   PREDNISONE (DELTASONE) 20 MG TABLET    Two daily with food     Elvina SidleKurt Chett Taniguchi, MD 06/08/16 1124

## 2016-06-08 NOTE — ED Triage Notes (Signed)
PT reports a shooting pain from right shoulder blade down right arm for 1 week. No known injury.

## 2016-06-08 NOTE — Discharge Instructions (Signed)
When you get home today, take your medicine and apply heat to the affected area.  Continue your blood pressure medicine.  Return if pain does not subside in the next day or so.

## 2016-06-11 ENCOUNTER — Encounter: Payer: Self-pay | Admitting: Nurse Practitioner

## 2016-06-11 ENCOUNTER — Ambulatory Visit (INDEPENDENT_AMBULATORY_CARE_PROVIDER_SITE_OTHER)
Admission: RE | Admit: 2016-06-11 | Discharge: 2016-06-11 | Disposition: A | Discharge status: Home / Self Care | Payer: Managed Care, Other (non HMO) | Source: Ambulatory Visit | Attending: Nurse Practitioner | Admitting: Nurse Practitioner

## 2016-06-11 ENCOUNTER — Other Ambulatory Visit: Payer: Managed Care, Other (non HMO)

## 2016-06-11 ENCOUNTER — Ambulatory Visit (INDEPENDENT_AMBULATORY_CARE_PROVIDER_SITE_OTHER): Payer: Managed Care, Other (non HMO) | Admitting: Nurse Practitioner

## 2016-06-11 VITALS — BP 166/92 | HR 63 | Temp 98.6°F | Ht 65.0 in | Wt 179.0 lb

## 2016-06-11 DIAGNOSIS — M25511 Pain in right shoulder: Secondary | ICD-10-CM | POA: Diagnosis not present

## 2016-06-11 DIAGNOSIS — G8929 Other chronic pain: Secondary | ICD-10-CM

## 2016-06-11 MED ORDER — KETOROLAC TROMETHAMINE 30 MG/ML IJ SOLN
30.0000 mg | Freq: Once | INTRAMUSCULAR | Status: AC
  Administered 2016-06-11: 30 mg via INTRAMUSCULAR

## 2016-06-11 NOTE — Progress Notes (Signed)
Subjective:  Patient ID: Terry Hull, female    DOB: 02/26/1956  Age: 61 y.o. MRN: [id number]  CC: Hospitalization Follow-up (ER follow up/right arm still in pain,pernison and oxycondine didnt help. )   Shoulder Pain   The pain is present in the right shoulder. This is a chronic problem. The current episode started more than 1 year ago. There has been no history of extremity trauma. The problem occurs intermittently. The problem has been waxing and waning. The quality of the pain is described as aching. Pertinent negatives include no fever, inability to bear weight, itching, joint locking, joint swelling, limited range of motion, numbness, stiffness or tingling. The symptoms are aggravated by lying down and activity. She has tried oral narcotics (and oral prednisone) for the symptoms. The treatment provided mild relief. Family history does not include gout or rheumatoid arthritis. Her past medical history is significant for diabetes and osteoarthritis. There is no history of gout or rheumatoid arthritis.    Outpatient Medications Prior to Visit  Medication Sig Dispense Refill  . albuterol (PROVENTIL HFA;VENTOLIN HFA) 108 (90 BASE) MCG/ACT inhaler Inhale 2 puffs into the lungs every 6 (six) hours as needed for wheezing or shortness of breath. 1 Inhaler 2  . amLODipine (NORVASC) 5 MG tablet Take 1 tablet (5 mg total) by mouth daily. 90 tablet 3  . carvedilol (COREG) 6.25 MG tablet TAKE 1 TABLET BY MOUTH TWICE DAILY WITH MEALS 60 tablet 5  . citalopram (CELEXA) 20 MG tablet TAKE 1 TABLET BY MOUTH DAILY 90 tablet 1  . diclofenac (VOLTAREN) 75 MG EC tablet TAKE 1 TABLET(75 MG) BY MOUTH TWICE DAILY AS NEEDED FOR MILD PAIN OR MODERATE PAIN 60 tablet 5  . fluticasone (FLONASE) 50 MCG/ACT nasal spray Place 1 spray into both nostrils daily. 16 g 2  . hydrochlorothiazide (MICROZIDE) 12.5 MG capsule TAKE 1 CAPSULE(12.5 MG) BY MOUTH DAILY 90 capsule 3  . Insulin Pen Needle 31G X 5 MM MISC Use to inject  insulin twice per day as directed with rx for bByetta. DX E11.09 200 each 3  . lisinopril (PRINIVIL,ZESTRIL) 40 MG tablet TAKE 1 TABLET(40 MG) BY MOUTH DAILY 90 tablet 3  . metFORMIN (GLUCOPHAGE-XR) 500 MG 24 hr tablet TAKE 4 TABLETS(2000 MG) BY MOUTH DAILY WITH SUPPER 120 tablet 3  . neomycin-polymyxin-hydrocortisone (CORTISPORIN) otic solution Place 4 drops into the right ear 4 (four) times daily. 10 mL 0  . ONE TOUCH LANCETS MISC Use to help check blood sugar twice a day 60 each 5  . ONE TOUCH ULTRA TEST test strip USE TO CHECK BLOOD SUGAR BEFORE BREAKFAST AND BEDTIME 100 each 0  . oxyCODONE-acetaminophen (PERCOCET/ROXICET) 5-325 MG tablet Take 2 tablets by mouth every 4 (four) hours as needed for severe pain. 15 tablet 0  . pravastatin (PRAVACHOL) 20 MG tablet TAKE 1 TABLET BY MOUTH DAILY 90 tablet 3  . predniSONE (DELTASONE) 20 MG tablet Two daily with food 10 tablet 0  . traZODone (DESYREL) 50 MG tablet Take 1-2 tablets (50-100 mg total) by mouth at bedtime as needed for sleep. 60 tablet 3  . TRULICITY 1.5 MG/0.5ML SOPN INJECT UNDER THE SKIN WEEKLY AS DIRECTED 2 mL 0  . amoxicillin-clavulanate (AUGMENTIN) 875-125 MG tablet Take 1 tablet by mouth 2 (two) times daily. (Patient not taking: Reported on 06/11/2016) 20 tablet 0   No facility-administered medications prior to visit.     ROS See HPI  Objective:  BP (!) 166/92   Pulse 63   Temp  98.6 F (37 C)   Ht 5\' 5"  (1.651 m)   Wt 179 lb (81.2 kg)   SpO2 97%   BMI 29.79 kg/m   BP Readings from Last 3 Encounters:  06/11/16 (!) 166/92  06/08/16 (!) 206/94  05/14/16 (!) 142/74    Wt Readings from Last 3 Encounters:  06/11/16 179 lb (81.2 kg)  06/08/16 177 lb (80.3 kg)  05/14/16 177 lb (80.3 kg)    Physical Exam  Constitutional: She is oriented to person, place, and time.  Cardiovascular: Normal rate.   Pulmonary/Chest: Effort normal.  Musculoskeletal: She exhibits tenderness. She exhibits no edema.       Right shoulder:  She exhibits pain. She exhibits normal range of motion, no tenderness, no bony tenderness, no swelling, no effusion, no crepitus, no deformity, no laceration, no spasm, normal pulse and normal strength.       Right elbow: Normal.      Right wrist: Normal.       Cervical back: Normal.       Thoracic back: Normal.       Right upper arm: Normal.       Right forearm: Normal.       Right hand: Normal.  Neurological: She is alert and oriented to person, place, and time.  Vitals reviewed.   Lab Results  Component Value Date   WBC 8.2 03/07/2015   HGB 13.3 03/07/2015   HCT 40.4 03/07/2015   PLT 307.0 03/07/2015   GLUCOSE 148 (H) 06/05/2016   CHOL 159 01/18/2016   TRIG 238.0 (H) 01/18/2016   HDL 58.00 01/18/2016   LDLDIRECT 72.0 01/18/2016   LDLCALC 82 03/07/2015   ALT 36 (H) 01/18/2016   AST 22 01/18/2016   NA 139 06/05/2016   K 4.0 06/05/2016   CL 101 06/05/2016   CREATININE 0.88 06/05/2016   BUN 18 06/05/2016   CO2 32 06/05/2016   TSH 0.58 01/21/2015   HGBA1C 6.4 01/18/2016   MICROALBUR 18.8 (H) 01/23/2016    No results found.  Assessment & Plan:   Zella BallRobin was seen today for hospitalization follow-up.  Diagnoses and all orders for this visit:  Chronic right shoulder pain -     ketorolac (TORADOL) 30 MG/ML injection 30 mg; Inject 1 mL (30 mg total) into the muscle once. -     DG Shoulder Right; Future -     Ambulatory referral to Sports Medicine   I have discontinued Ms. Walters's amoxicillin-clavulanate. I am also having her maintain her albuterol, ONE TOUCH LANCETS, Insulin Pen Needle, ONE TOUCH ULTRA TEST, diclofenac, metFORMIN, amLODipine, hydrochlorothiazide, traZODone, carvedilol, pravastatin, citalopram, lisinopril, neomycin-polymyxin-hydrocortisone, fluticasone, TRULICITY, predniSONE, and oxyCODONE-acetaminophen. We administered ketorolac.  Meds ordered this encounter  Medications  . ketorolac (TORADOL) 30 MG/ML injection 30 mg    Follow-up: Return if symptoms  worsen or fail to improve.  Alysia Pennaharlotte Luanna Weesner, NP

## 2016-06-11 NOTE — Progress Notes (Signed)
Patient received education resource, including the self-management goal and tool. Patient verbalized understanding. 

## 2016-06-11 NOTE — Patient Instructions (Signed)
Go to basement for x-ray. You will be called with results. Use voltaren as prescribed.  Shoulder Pain Many things can cause shoulder pain, including:  An injury to the area.  Overuse of the shoulder.  Arthritis. The source of the pain can be:  Inflammation.  An injury to the shoulder joint.  An injury to a tendon, ligament, or bone. Follow these instructions at home: Take these actions to help with your pain:  Squeeze a soft ball or a foam pad as much as possible. This helps to keep the shoulder from swelling. It also helps to strengthen the arm.  Take over-the-counter and prescription medicines only as told by your health care provider.  If directed, apply ice to the area:  Put ice in a plastic bag.  Place a towel between your skin and the bag.  Leave the ice on for 20 minutes, 2-3 times per day. Stop applying ice if it does not help with the pain.  If you were given a shoulder sling or immobilizer:  Wear it as told.  Remove it to shower or bathe.  Move your arm as little as possible, but keep your hand moving to prevent swelling. Contact a health care provider if:  Your pain gets worse.  Your pain is not relieved with medicines.  New pain develops in your arm, hand, or fingers. Get help right away if:  Your arm, hand, or fingers:  Tingle.  Become numb.  Become swollen.  Become painful.  Turn white or blue. This information is not intended to replace advice given to you by your health care provider. Make sure you discuss any questions you have with your health care provider. Document Released: 01/24/2005 Document Revised: 12/11/2015 Document Reviewed: 08/09/2014 Elsevier Interactive Patient Education  2017 [employer]

## 2016-06-13 ENCOUNTER — Encounter: Payer: Self-pay | Admitting: Nurse Practitioner

## 2016-06-13 ENCOUNTER — Telehealth: Payer: Self-pay | Admitting: Internal Medicine

## 2016-06-13 MED ORDER — TRAMADOL HCL 50 MG PO TABS: 50.0000 mg | ORAL_TABLET | Freq: Three times a day (TID) | ORAL | 0 refills | Status: DC | PRN

## 2016-06-13 NOTE — Telephone Encounter (Signed)
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 She was hopping to get some help today. Pt has appt with Dr. Katrinka BlazingSmith on 06/28/2016. Please advise, Claris GowerCharlotte is out of the office.

## 2016-06-13 NOTE — Telephone Encounter (Signed)
I sent send in tramadol for a couple of days but it may not help much if the other pain medication did not help much.  We can schedule her with greg early next week.  Let me know about the tramadol

## 2016-06-13 NOTE — Telephone Encounter (Signed)
rx printed - ok to fax

## 2016-06-13 NOTE — Telephone Encounter (Signed)
Pt called in and said that she is still in pain and wants to know what can be done?  She can not work with the pain ?    Best number [phone]

## 2016-06-13 NOTE — Telephone Encounter (Signed)
Pt agree to try tramadol, [facility] or pt has to come pick this up? And appt with Tammy SoursGreg is set for Monday 05/18/2016.

## 2016-06-13 NOTE — Telephone Encounter (Signed)
[facility] [address] [facility].

## 2016-06-18 ENCOUNTER — Ambulatory Visit (INDEPENDENT_AMBULATORY_CARE_PROVIDER_SITE_OTHER): Payer: Managed Care, Other (non HMO) | Admitting: Family

## 2016-06-18 ENCOUNTER — Encounter: Payer: Self-pay | Admitting: Family

## 2016-06-18 DIAGNOSIS — M62838 Other muscle spasm: Secondary | ICD-10-CM | POA: Diagnosis not present

## 2016-06-18 MED ORDER — TIZANIDINE HCL 4 MG PO TABS
4.0000 mg | ORAL_TABLET | Freq: Four times a day (QID) | ORAL | 0 refills | Status: DC | PRN
Start: 2016-06-18 — End: 2016-11-06

## 2016-06-18 MED ORDER — IBUPROFEN-FAMOTIDINE 800-26.6 MG PO TABS: 1.0000 | ORAL_TABLET | Freq: Three times a day (TID) | ORAL | 0 refills | Status: DC | PRN

## 2016-06-18 NOTE — Progress Notes (Signed)
Subjective:    Patient ID: Terry Hull, female    DOB: 02/22/1956, 61 y.o.   MRN: [id number]  Chief Complaint  Patient presents with  . Shoulder Pain    HPI:  Terry BruinRobin B Aguallo is a 61 y.o. female who  has a past medical history of Abnormal electrocardiogram; ALLERGIC RHINITIS; ARTHRITIS; DIABETES MELLITUS, TYPE II; HYPERTENSION; HYPERTRIGLYCERIDEMIA; and SLEEP APNEA. and presents today for an office visit.   Recently evaluated in urgent care and in the office for pain located in her right shoulder blade and down her right arm that had been going on for about 1 week. Denied any trauma or injury at the time. She was diagnosed with a shoulder strain and prescribed short courses of percocet and prednisone. Was then seen in the office with mild improvements. She was then seen in the office and given an injection of Toradol which did not help very much. Continues to experience the associated symptoms of pain located in her right neck and shoulder and described with a severity that is excruciating. No numbness or tingling in the right upper extremity. No sounds or sensations heard or felt. Modifying factors include lying down which helps to make it better. Aggravated with sitting at a desk and typing. No other treatments attempted independently. Most recent shoulder x-ray reviewed and appears normal.   Allergies  Allergen Reactions  . Oxycodone       Outpatient Medications Prior to Visit  Medication Sig Dispense Refill  . albuterol (PROVENTIL HFA;VENTOLIN HFA) 108 (90 BASE) MCG/ACT inhaler Inhale 2 puffs into the lungs every 6 (six) hours as needed for wheezing or shortness of breath. 1 Inhaler 2  . amLODipine (NORVASC) 5 MG tablet Take 1 tablet (5 mg total) by mouth daily. 90 tablet 3  . carvedilol (COREG) 6.25 MG tablet TAKE 1 TABLET BY MOUTH TWICE DAILY WITH MEALS 60 tablet 5  . citalopram (CELEXA) 20 MG tablet TAKE 1 TABLET BY MOUTH DAILY 90 tablet 1  . fluticasone (FLONASE) 50 MCG/ACT  nasal spray Place 1 spray into both nostrils daily. 16 g 2  . hydrochlorothiazide (MICROZIDE) 12.5 MG capsule TAKE 1 CAPSULE(12.5 MG) BY MOUTH DAILY 90 capsule 3  . Insulin Pen Needle 31G X 5 MM MISC Use to inject insulin twice per day as directed with rx for bByetta. DX E11.09 200 each 3  . lisinopril (PRINIVIL,ZESTRIL) 40 MG tablet TAKE 1 TABLET(40 MG) BY MOUTH DAILY 90 tablet 3  . metFORMIN (GLUCOPHAGE-XR) 500 MG 24 hr tablet TAKE 4 TABLETS(2000 MG) BY MOUTH DAILY WITH SUPPER 120 tablet 3  . neomycin-polymyxin-hydrocortisone (CORTISPORIN) otic solution Place 4 drops into the right ear 4 (four) times daily. 10 mL 0  . ONE TOUCH LANCETS MISC Use to help check blood sugar twice a day 60 each 5  . ONE TOUCH ULTRA TEST test strip USE TO CHECK BLOOD SUGAR BEFORE BREAKFAST AND BEDTIME 100 each 0  . pravastatin (PRAVACHOL) 20 MG tablet TAKE 1 TABLET BY MOUTH DAILY 90 tablet 3  . traZODone (DESYREL) 50 MG tablet Take 1-2 tablets (50-100 mg total) by mouth at bedtime as needed for sleep. 60 tablet 3  . TRULICITY 1.5 MG/0.5ML SOPN INJECT UNDER THE SKIN WEEKLY AS DIRECTED 2 mL 0  . diclofenac (VOLTAREN) 75 MG EC tablet TAKE 1 TABLET(75 MG) BY MOUTH TWICE DAILY AS NEEDED FOR MILD PAIN OR MODERATE PAIN 60 tablet 5  . predniSONE (DELTASONE) 20 MG tablet Two daily with food (Patient not taking: Reported on  06/18/2016) 10 tablet 0  . traMADol (ULTRAM) 50 MG tablet Take 1 tablet (50 mg total) by mouth every 8 (eight) hours as needed. (Patient not taking: Reported on 06/18/2016) 20 tablet 0   No facility-administered medications prior to visit.       Past Surgical History:  Procedure Laterality Date  . ABDOMINAL HYSTERECTOMY    . HAND TENDON SURGERY    . NASAL POLYP SURGERY        Past Medical History:  Diagnosis Date  . Abnormal electrocardiogram    consistent with LVH hypertrophy  . ALLERGIC RHINITIS   . ARTHRITIS   . DIABETES MELLITUS, TYPE II    follows with endo  . HYPERTENSION   .  HYPERTRIGLYCERIDEMIA   . SLEEP APNEA       Review of Systems  Constitutional: Negative for chills and fever.  Musculoskeletal: Positive for neck pain and neck stiffness.  Neurological: Negative for weakness and numbness.      Objective:    BP 132/80   Pulse 89   Temp 98.4 F (36.9 C)   Ht 5' 5.5" (1.664 m)   Wt 178 lb (80.7 kg)   SpO2 96%   BMI 29.17 kg/m  Nursing note and vital signs reviewed.  Physical Exam  Constitutional: She is oriented to person, place, and time. She appears well-developed and well-nourished. No distress.  Neck:  No obvious deformity, discoloration, or edema. Palpable tenderness along right paraspinal musculature especially upper trapezius to no crepitus or deformity but significantly increased muscle tone consistent with muscle spasm. Range of motion limited in lateral bending and rotation. Positive cervical compression test. Distal pulses are intact and appropriate.  Cardiovascular: Normal rate, regular rhythm, normal heart sounds and intact distal pulses.   Pulmonary/Chest: Effort normal and breath sounds normal.  Musculoskeletal:  Right shoulder - no obvious deformity, discoloration, or edema. There is generalized palpable tenderness across the entire right shoulder and into the upper arm. Range of motion with some discomfort noted especially in the upper trapezius. Distal pulses and sensation are intact and appropriate. Positive Neer's impingement; positive Leanord Asal; negative empty can.   Neurological: She is alert and oriented to person, place, and time.  Skin: Skin is warm and dry.  Psychiatric: She has a normal mood and affect. Her behavior is normal. Judgment and thought content normal.       Assessment & Plan:   Problem List Items Addressed This Visit      Other   Cervical paraspinal muscle spasm    Symptoms and exam are consistent with cervical paraspinal muscle spasms most likely radiating into the right upper extremity. Systems  neuropathic pain most likely related to increased muscle tone and spasm. Start Zanaflex. Start Duexis. Discontinue diclofenac. Recommend aggressive ice/moist heat and home exercise therapy. Previous prednisone with little effect and will hold at this time. Continue to monitor and follow-up if symptoms worsen or do not improve.      Relevant Medications   tiZANidine (ZANAFLEX) 4 MG tablet       I have discontinued Ms. Rhatigan's diclofenac. I am also having her start on tiZANidine and Ibuprofen-Famotidine. Additionally, I am having her maintain her albuterol, ONE TOUCH LANCETS, Insulin Pen Needle, ONE TOUCH ULTRA TEST, metFORMIN, amLODipine, hydrochlorothiazide, traZODone, carvedilol, pravastatin, citalopram, lisinopril, neomycin-polymyxin-hydrocortisone, fluticasone, TRULICITY, predniSONE, and traMADol.   Meds ordered this encounter  Medications  . tiZANidine (ZANAFLEX) 4 MG tablet    Sig: Take 1 tablet (4 mg total) by mouth every 6 (six)  hours as needed for muscle spasms.    Dispense:  60 tablet    Refill:  0    Order Specific Question:   Supervising Provider    Answer:   Hillard Danker A [[id number]]  . Ibuprofen-Famotidine 800-26.6 MG TABS    Sig: Take 1 tablet by mouth 3 (three) times daily as needed.    Dispense:  9 tablet    Refill:  0    Order Specific Question:   Supervising Provider    Answer:   Hillard Danker A [[id number]]    A total of 25 minutes were spent face-to-face with the patient during this encounter and over half of that time was spent on counseling and coordination of care.  We discussed in depth the associated injury, time to recovery, medication and good posture/ergonomics. All patient questions were answered.     Follow-up: Return if symptoms worsen or fail to improve.  Jeanine Luz, FNP

## 2016-06-18 NOTE — Assessment & Plan Note (Signed)
Symptoms and exam are consistent with cervical paraspinal muscle spasms most likely radiating into the right upper extremity. Systems neuropathic pain most likely related to increased muscle tone and spasm. Start Zanaflex. Start Duexis. Discontinue diclofenac. Recommend aggressive ice/moist heat and home exercise therapy. Previous prednisone with little effect and will hold at this time. Continue to monitor and follow-up if symptoms worsen or do not improve.

## 2016-06-18 NOTE — Patient Instructions (Signed)
Thank you for choosing [facility].  SUMMARY AND INSTRUCTIONS:  Ice / moist heat x 20 minutes every 2 hours and as needed.  Thermacare as needed for discomfort.  Stretches and exercises multiple times thoughout the day.  Good posture.    Medication:  Your prescription(s) have been submitted to your pharmacy or been printed and provided for you. Please take as directed and contact our office if you believe you are having problem(s) with the medication(s) or have any questions.   Follow up:  If your symptoms worsen or fail to improve, please contact our office for further instruction, or in case of emergency go directly to the emergency room at the closest medical facility.     Cervical Strain and Sprain Rehab Ask your health care provider which exercises are safe for you. Do exercises exactly as told by your health care provider and adjust them as directed. It is normal to feel mild stretching, pulling, tightness, or discomfort as you do these exercises, but you should stop right away if you feel sudden pain or your pain gets worse.Do not begin these exercises until told by your health care provider. Stretching and range of motion exercises These exercises warm up your muscles and joints and improve the movement and flexibility of your neck. These exercises also help to relieve pain, numbness, and tingling. Exercise A: Cervical side bend 1. Using good posture, sit on a stable chair or stand up. 2. Without moving your shoulders, slowly tilt your left / right ear to your shoulder until you feel a stretch in your neck muscles. You should be looking straight ahead. 3. Hold for __________ seconds. 4. Repeat with the other side of your neck. Repeat __________ times. Complete this exercise __________ times a day. Exercise B: Cervical rotation 1. Using good posture, sit on a stable chair or stand up. 2. Slowly turn your head to the side as if you are looking over your left / right  shoulder.  Keep your eyes level with the ground.  Stop when you feel a stretch along the side and the back of your neck. 3. Hold for __________ seconds. 4. Repeat this by turning to your other side. Repeat __________ times. Complete this exercise __________ times a day. Exercise C: Thoracic extension and pectoral stretch 1. Roll a towel or a small blanket so it is about 4 inches (10 cm) in diameter. 2. Lie down on your back on a firm surface. 3. Put the towel lengthwise, under your spine in the middle of your back. It should not be not under your shoulder blades. The towel should line up with your spine from your middle back to your lower back. 4. Put your hands behind your head and let your elbows fall out to your sides. 5. Hold for __________ seconds. Repeat __________ times. Complete this exercise __________ times a day. Strengthening exercises These exercises build strength and endurance in your neck. Endurance is the ability to use your muscles for a long time, even after your muscles get tired. Exercise D: Upper cervical flexion, isometric 1. Lie on your back with a thin pillow behind your head and a small rolled-up towel under your neck. 2. Gently tuck your chin toward your chest and nod your head down to look toward your feet. Do not lift your head off the pillow. 3. Hold for __________ seconds. 4. Release the tension slowly. Relax your neck muscles completely before you repeat this exercise. Repeat __________ times. Complete this exercise __________ times  a day. Exercise E: Cervical extension, isometric 1. Stand about 6 inches (15 cm) away from a wall, with your back facing the wall. 2. Place a soft object, about 6-8 inches (15-20 cm) in diameter, between the back of your head and the wall. A soft object could be a small pillow, a ball, or a folded towel. 3. Gently tilt your head back and press into the soft object. Keep your jaw and forehead relaxed. 4. Hold for __________  seconds. 5. Release the tension slowly. Relax your neck muscles completely before you repeat this exercise. Repeat __________ times. Complete this exercise __________ times a day. Posture and body mechanics   Body mechanics refers to the movements and positions of your body while you do your daily activities. Posture is part of body mechanics. Good posture and healthy body mechanics can help to relieve stress in your body's tissues and joints. Good posture means that your spine is in its natural S-curve position (your spine is neutral), your shoulders are pulled back slightly, and your head is not tipped forward. The following are general guidelines for applying improved posture and body mechanics to your everyday activities. Standing  When standing, keep your spine neutral and keep your feet about hip-width apart. Keep a slight bend in your knees. Your ears, shoulders, and hips should line up.  When you do a task in which you stand in one place for a long time, place one foot up on a stable object that is 2-4 inches (5-10 cm) high, such as a footstool. This helps keep your spine neutral. Sitting  When sitting, keep your spine neutral and your keep feet flat on the floor. Use a footrest, if necessary, and keep your thighs parallel to the floor. Avoid rounding your shoulders, and avoid tilting your head forward.  When working at a desk or a computer, keep your desk at a height where your hands are slightly lower than your elbows. Slide your chair under your desk so you are close enough to maintain good posture.  When working at a computer, place your monitor at a height where you are looking straight ahead and you do not have to tilt your head forward or downward to look at the screen. Resting When lying down and resting, avoid positions that are most painful for you. Try to support your neck in a neutral position. You can use a contour pillow or a small rolled-up towel. Your pillow should support  your neck but not push on it. This information is not intended to replace advice given to you by your health care provider. Make sure you discuss any questions you have with your health care provider. Document Released: 04/16/2005 Document Revised: 12/22/2015 Document Reviewed: 03/23/2015 Elsevier Interactive Patient Education  2017 [employer].

## 2016-06-20 ENCOUNTER — Other Ambulatory Visit: Payer: Self-pay | Admitting: Endocrinology

## 2016-06-27 ENCOUNTER — Ambulatory Visit (INDEPENDENT_AMBULATORY_CARE_PROVIDER_SITE_OTHER): Payer: Managed Care, Other (non HMO) | Admitting: Endocrinology

## 2016-06-27 ENCOUNTER — Encounter: Payer: Self-pay | Admitting: Endocrinology

## 2016-06-27 VITALS — BP 144/70 | HR 61 | Ht 66.0 in | Wt 179.0 lb

## 2016-06-27 DIAGNOSIS — E1165 Type 2 diabetes mellitus with hyperglycemia: Secondary | ICD-10-CM | POA: Diagnosis not present

## 2016-06-27 NOTE — Progress Notes (Signed)
Patient ID: Terry Hull, female   DOB: 1955-12-19, 61 y.o.   MRN: [id number]           Reason for Appointment: Follow-up for Type 2 Diabetes   History of Present Illness:          Date of diagnosis of type 2 diabetes mellitus:   2013       Background history:   Patient had been complaining of fatigue at the time of diagnosis and lab glucose indicated her level was 453 She was initially treated with metformin but she said that since this made her sleepy she did not continue this Subsequently he has been on the variety of medications including Januvia which she is still taking She thinks she had fairly good control with Actos 45 mg but was stopped because of weight gain in 11/2013 At that time she was started on Victoza Her blood sugars were significantly high prior to her initial consultation. On her initial visit she was started on metformin  Recent history:   She has been on Trulicity 1.5 mg since 2/17 A1c is still excellent at 6.2 previously 6.4  Current blood sugar patterns and problems identified:   She has checked her blood sugars very infrequently  However her blood sugars overall are still fairly good, she thinks they may be higher more recently because of persistent shoulder pain  She has lost a couple of pounds since her last visit  Continues to benefit from Trulicity, taking 1.5 mg  Overall she thinks she is watching her diet  However has been somewhat inconsistent with exercise lately   Hypoglycemic drugs the patient is taking are: Metformin ER 2000 mg a day     Side effects from medications have been:Weight gain form Actos   Compliance with the medical regimen: Fair  Glucose monitoring:  done usually 1-2 times a day         Glucometer: One Touch.      Blood Glucose readings by  monitor download shows range of 119-144, only 3 readings in the morning  Self-care:  Typical meal intake: Breakfast is egg White omelette with vegetables, lunch is a salad, dinner  is a sandwich and has snacks with food.   Avoiding drinks with sugar                Dietician visit, most recent: 01/21/15               Exercise: Walking 30 minutes, 1-3/7  Weight history: Previous range 145-201  Wt Readings from Last 3 Encounters:  06/28/16 176 lb (79.8 kg)  06/27/16 179 lb (81.2 kg)  06/18/16 178 lb (80.7 kg)    Glycemic control:   Lab Results  Component Value Date   HGBA1C 6.4 01/18/2016   HGBA1C 6.3 09/07/2015   HGBA1C 6.0 06/08/2015   Lab Results  Component Value Date   MICROALBUR 18.8 (H) 01/23/2016   LDLCALC 82 03/07/2015   CREATININE 0.88 06/05/2016    No visits with results within 1 Week(s) from this visit.  Latest known visit with results is:  Lab on 06/05/2016  Component Date Value Ref Range Status  . Sodium 06/05/2016 139  135 - 145 mEq/L Final  . Potassium 06/05/2016 4.0  3.5 - 5.1 mEq/L Final  . Chloride 06/05/2016 101  96 - 112 mEq/L Final  . CO2 06/05/2016 32  19 - 32 mEq/L Final  . Glucose, Bld 06/05/2016 148* 70 - 99 mg/dL Final  . BUN 40/98/1191 18  6 - 23 mg/dL Final  . Creatinine, Ser 06/05/2016 0.88  0.40 - 1.20 mg/dL Final  . Calcium 19/14/7829 10.3  8.4 - 10.5 mg/dL Final  . GFR 56/21/3086 83.91  >60.00 mL/min Final      Allergies as of 06/27/2016      Reactions   Oxycodone       Medication List       Accurate as of 06/27/16 11:59 PM. Always use your most recent med list.          albuterol 108 (90 Base) MCG/ACT inhaler Commonly known as:  PROVENTIL HFA;VENTOLIN HFA Inhale 2 puffs into the lungs every 6 (six) hours as needed for wheezing or shortness of breath.   amLODipine 5 MG tablet Commonly known as:  NORVASC Take 1 tablet (5 mg total) by mouth daily.   carvedilol 6.25 MG tablet Commonly known as:  COREG TAKE 1 TABLET BY MOUTH TWICE DAILY WITH MEALS   citalopram 20 MG tablet Commonly known as:  CELEXA TAKE 1 TABLET BY MOUTH DAILY   fluticasone 50 MCG/ACT nasal spray Commonly known as:   FLONASE Place 1 spray into both nostrils daily.   hydrochlorothiazide 12.5 MG capsule Commonly known as:  MICROZIDE TAKE 1 CAPSULE(12.5 MG) BY MOUTH DAILY   Ibuprofen-Famotidine 800-26.6 MG Tabs Take 1 tablet by mouth 3 (three) times daily as needed.   Insulin Pen Needle 31G X 5 MM Misc Use to inject insulin twice per day as directed with rx for bByetta. DX E11.09   lisinopril 40 MG tablet Commonly known as:  PRINIVIL,ZESTRIL TAKE 1 TABLET(40 MG) BY MOUTH DAILY   metFORMIN 500 MG 24 hr tablet Commonly known as:  GLUCOPHAGE-XR TAKE 4 TABLETS(2000 MG) BY MOUTH DAILY WITH SUPPER   neomycin-polymyxin-hydrocortisone otic solution Commonly known as:  CORTISPORIN Place 4 drops into the right ear 4 (four) times daily.   ONE TOUCH LANCETS Misc Use to help check blood sugar twice a day   ONE TOUCH ULTRA TEST test strip Generic drug:  glucose blood USE TO CHECK BLOOD SUGAR BEFORE BREAKFAST AND BEDTIME   pravastatin 20 MG tablet Commonly known as:  PRAVACHOL TAKE 1 TABLET BY MOUTH DAILY   predniSONE 20 MG tablet Commonly known as:  DELTASONE Two daily with food   tiZANidine 4 MG tablet Commonly known as:  ZANAFLEX Take 1 tablet (4 mg total) by mouth every 6 (six) hours as needed for muscle spasms.   traMADol 50 MG tablet Commonly known as:  ULTRAM Take 1 tablet (50 mg total) by mouth every 8 (eight) hours as needed.   traZODone 50 MG tablet Commonly known as:  DESYREL Take 1-2 tablets (50-100 mg total) by mouth at bedtime as needed for sleep.   TRULICITY 1.5 MG/0.5ML Sopn Generic drug:  Dulaglutide INJECT UNDER THE SKIN WEEKLY AS DIRECTED       Allergies:  Allergies  Allergen Reactions  . Oxycodone     Past Medical History:  Diagnosis Date  . Abnormal electrocardiogram    consistent with LVH hypertrophy  . ALLERGIC RHINITIS   . ARTHRITIS   . DIABETES MELLITUS, TYPE II    follows with endo  . HYPERTENSION   . HYPERTRIGLYCERIDEMIA   . SLEEP APNEA      Past Surgical History:  Procedure Laterality Date  . ABDOMINAL HYSTERECTOMY    . HAND TENDON SURGERY    . NASAL POLYP SURGERY      Family History  Problem Relation Age of Onset  . Cancer Mother  Ovarian Cancer  . Hyperlipidemia Mother   . Hypertension Mother   . Diabetes Other     1st degree relative    Social History:  reports that she quit smoking about 3 years ago. She smoked 1.00 pack per day. She has never used smokeless tobacco. She reports that she drinks alcohol. She reports that she does not use drugs.    Review of Systems    Lipid history: On pravastatin for several years, has previously had  high triglycerides also    Lab Results  Component Value Date   CHOL 159 01/18/2016   HDL 58.00 01/18/2016   LDLCALC 82 03/07/2015   LDLDIRECT 72.0 01/18/2016   TRIG 238.0 (H) 01/18/2016   CHOLHDL 3 01/18/2016            Eyes:  Report of last eye exam not available  Hypertension: Treated with medications for 6-7 years, On 40 mg lisinopril, followed by PCP annually   LABS:  No visits with results within 1 Week(s) from this visit.  Latest known visit with results is:  Lab on 06/05/2016  Component Date Value Ref Range Status  . Sodium 06/05/2016 139  135 - 145 mEq/L Final  . Potassium 06/05/2016 4.0  3.5 - 5.1 mEq/L Final  . Chloride 06/05/2016 101  96 - 112 mEq/L Final  . CO2 06/05/2016 32  19 - 32 mEq/L Final  . Glucose, Bld 06/05/2016 148* 70 - 99 mg/dL Final  . BUN 16/10/960402/09/2016 18  6 - 23 mg/dL Final  . Creatinine, Ser 06/05/2016 0.88  0.40 - 1.20 mg/dL Final  . Calcium 54/09/811902/09/2016 10.3  8.4 - 10.5 mg/dL Final  . GFR 14/78/295602/09/2016 83.91  >60.00 mL/min Final    Physical Examination:  BP (!) 144/70   Pulse 61   Ht 5\' 6"  (1.676 m)   Wt 179 lb (81.2 kg)   SpO2 97%   BMI 28.89 kg/m        ASSESSMENT:  Diabetes type 2,  with obesity  See history of present illness for detailed discussion of  current management, blood sugar patterns and problems  identified  A1c is now 6.2  She has done well And has good control using metformin and Trulicity with no side effects She has not exercised as much  She does need to check more readings after meals as she is checking sporadically only in the mornings She tends to have relatively high fasting readings however   HYPERTENSION: Systolic upper normal, usually otherwise well-controlled and followed by PCP  PLAN:   Continue Trulicity 1.5 mg weekly.    Encourage her to walk at least 3-4 days a week  More blood sugar monitoring especially after evening meal  Follow-up in 4 months   Patient Instructions  Check blood sugars on waking up 2-3x per week  Also check blood sugars about 2 hours after a meal and do this after different meals by rotation  Recommended blood sugar levels on waking up is 90-130 and about 2 hours after meal is 130-160  Please bring your blood sugar monitor to each visit, thank you      [facility] CKUMAR,Terry Hull 06/28/2016, 12:40 PM   Note: This office note was prepared with Dragon voice recognition system technology. Any transcriptional errors that result from this process are unintentional.

## 2016-06-27 NOTE — Progress Notes (Signed)
Terry Hull D.O. [facility] [address] [place], [place] [address] Phone: [phone] Subjective:    I'm seeing this patient by the request  of:  Pincus SanesStacy J Burns, MD   CC: Shoulder pain right   BJY:[id number]:Subjective  Terry BruinRobin B Hull is a 61 y.o. female coming in with complaint of right shoulder pain as well as neck pain. Patient is on such severe amount of pain that she has even gone to the emergency department. Patient has had x-rays for this. X-rays were independently visualized by me. X-rays the patient's right shoulder shows the patient does have degenerative changes of the acromial clavicular joint mild. Otherwise unremarkable. Patient states it seems to radiate down her arm somewhat as well as the shoulder blade. Patient was given prednisone mild improvement initially. Describes the pain is severe and excruciating. Seems to be worse with sitting for long period of time or even typing. Patient is seen another provider and was started on a muscle relaxer as well as oral anti-inflammatories. Patient states still painful and worsening. Causes her to cry and affecting daily activities.      Past Medical History:  Diagnosis Date  . Abnormal electrocardiogram    consistent with LVH hypertrophy  . ALLERGIC RHINITIS   . ARTHRITIS   . DIABETES MELLITUS, TYPE II    follows with endo  . HYPERTENSION   . HYPERTRIGLYCERIDEMIA   . SLEEP APNEA    Past Surgical History:  Procedure Laterality Date  . ABDOMINAL HYSTERECTOMY    . HAND TENDON SURGERY    . NASAL POLYP SURGERY     Social History   Social History  . Marital status: Married    Spouse name: N/A  . Number of children: 1  . Years of education: N/A   Occupational History  . [employer]istant Manager [employer]   Social History Main Topics  . Smoking status: Former Smoker    Packs/day: 1.00    Quit date: 12/29/2012  . Smokeless tobacco: Never Used  . Alcohol use Yes     Comment: 4 per week  . Drug use:  No  . Sexual activity: Not Asked   Other Topics Concern  . 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 Oxycodone    Family History  Problem Relation Age of Onset  . Cancer Mother     Ovarian Cancer  . Hyperlipidemia Mother   . Hypertension Mother   . Diabetes Other     1st degree relative    Past medical history, social, surgical and family history all reviewed in electronic medical record.  No pertanent information unless stated regarding to the chief complaint.   Review of Systems:Review of systems updated and as accurate as of 06/28/16  No headache, visual changes, nausea, vomiting, diarrhea, constipation, dizziness, abdominal pain, skin rash, fevers, chills, night sweats, weight loss, swollen lymph nodes, body aches, joint swelling, muscle aches, chest pain, shortness of breath, mood changes.   Objective  Blood pressure 132/86, pulse 69, height 5' 5.5" (1.664 m), weight 176 lb (79.8 kg), SpO2 98 %. Systems examined below as of 06/28/16   General: No apparent distress alert and oriented x3 mood and affect normal, dressed appropriately.  HEENT: Pupils equal, extraocular movements intact  Respiratory: Patient's speak in full sentences and does not appear short  of breath  Cardiovascular: No lower extremity edema, non tender, no erythema  Skin: Warm dry intact with no signs of infection or rash on extremities or on axial skeleton.  Abdomen: Soft nontender  Neuro: Cranial nerves II through XII are intact, neurovascularly intact in all extremities with 2+ DTRs and 2+ pulses.  Lymph: No lymphadenopathy of posterior or anterior cervical chain or axillae bilaterally.  Gait normal with good balance and coordination.  MSK:  Non tender with full range of motion and good stability and symmetric strength and tone of , elbows, wrist, hip, knee and ankles bilaterally.    Neck: Inspection unremarkable. No palpable stepoffs. positive Spurling's maneuver.  loss 10% of extension.  Grip strength and sensation normal in bilateral hands Strength good C4 to T1 distribution No sensory change to C4 to T1 Negative Hoffman sign bilaterally Reflexes normal  Shoulder: Right Inspection reveals no abnormalities, atrophy or asymmetry. Palpation is normal with no tenderness over AC joint or bicipital groove. ROM is full in all planes passively. Rotator cuff strength 4+ out of 5 signs of impingement with positive Neer and Hawkin's tests, but negative empty can sign. Speeds and Yergason's tests normal. No labral pathology noted with negative Obrien's, negative clunk and good stability. Normal scapular function observed. No painful arc and no drop arm sign. No apprehension sign  MSK US performed of: Right This study was ordered, performed, and interpreted by Terrilee Files D.O.  Shoulder:   Supraspinatus:  Partial tear with no retraction.  Bursal bulge seen with shoulder abduction on impingement view. Infraspinatus:  Appears normal on long and transverse views. Significant increase in Doppler flow Subscapularis:  Appears normal on long and transverse views. Positive bursa Teres Minor:  Appears normal on long and transverse views. AC joint:  Mild swelling.  Glenohumeral Joint:  Appears normal without effusion. Glenoid Labrum:  Intact without visualized tears. Biceps Tendon:  Appears normal on long and transverse views, no fraying of tendon, tendon located in intertubercular groove, no subluxation with shoulder internal or external rotation.  Impression: Subacromial bursitis, rotator cuff tear, partial.   Procedure: Real-time Ultrasound Guided Injection of right glenohumeral joint Device: GE Logiq E  Ultrasound guided injection is preferred based studies that show increased duration, increased effect, greater accuracy, decreased procedural pain, increased response  rate with ultrasound guided versus blind injection.  Verbal informed consent obtained.  Time-out conducted.  Noted no overlying erythema, induration, or other signs of local infection.  Skin prepped in a sterile fashion.  Local anesthesia: Topical Ethyl chloride.  With sterile technique and under real time ultrasound guidance:  Joint visualized.  23g 1  inch needle inserted posterior approach. Pictures taken for needle placement. Patient did have injection of 2 cc of 1% lidocaine, 2 cc of 0.5% Marcaine, and 1.0 cc of Kenalog 40 mg/dL. Completed without difficulty  Pain immediately resolved suggesting accurate placement of the medication.  Advised to call if fevers/chills, erythema, induration, drainage, or persistent bleeding.  Images permanently stored and available for review in the ultrasound unit.  Impression: Technically successful ultrasound guided injection.  Procedure note 97110; 15 minutes spent for Therapeutic exercises as stated in above notes.  This included exercises focusing on stretching, strengthening, with significant focus on eccentric aspects.  .Shoulder Exercises that included:  Basic scapular stabilization to include adduction and depression of scapula Scaption, focusing on proper movement and good control Internal and External rotation utilizing a theraband, with elbow tucked at side entire time Rows with theraband   Proper  technique shown and discussed handout in great detail with ATC.  All questions were discussed and answered.     Impression and Recommendations:     This case required medical decision making of moderate complexity.      Note: This dictation was prepared with Dragon dictation along with smaller phrase technology. Any transcriptional errors that result from this process are unintentional.

## 2016-06-27 NOTE — Patient Instructions (Signed)
Check blood sugars on waking up  2-3x per week  Also check blood sugars about 2 hours after a meal and do this after different meals by rotation  Recommended blood sugar levels on waking up is 90-130 and about 2 hours after meal is 130-160  Please bring your blood sugar monitor to each visit, thank you  

## 2016-06-28 ENCOUNTER — Ambulatory Visit (INDEPENDENT_AMBULATORY_CARE_PROVIDER_SITE_OTHER)
Admission: RE | Admit: 2016-06-28 | Discharge: 2016-06-28 | Disposition: A | Discharge status: Home / Self Care | Payer: Managed Care, Other (non HMO) | Source: Ambulatory Visit | Attending: Family Medicine | Admitting: Family Medicine

## 2016-06-28 ENCOUNTER — Encounter: Payer: Self-pay | Admitting: Family Medicine

## 2016-06-28 ENCOUNTER — Ambulatory Visit (INDEPENDENT_AMBULATORY_CARE_PROVIDER_SITE_OTHER): Payer: Managed Care, Other (non HMO) | Admitting: Family Medicine

## 2016-06-28 ENCOUNTER — Ambulatory Visit: Payer: Self-pay

## 2016-06-28 VITALS — BP 132/86 | HR 69 | Ht 65.5 in | Wt 176.0 lb

## 2016-06-28 DIAGNOSIS — M25511 Pain in right shoulder: Secondary | ICD-10-CM | POA: Diagnosis not present

## 2016-06-28 DIAGNOSIS — M75111 Incomplete rotator cuff tear or rupture of right shoulder, not specified as traumatic: Secondary | ICD-10-CM

## 2016-06-28 DIAGNOSIS — M75112 Incomplete rotator cuff tear or rupture of left shoulder, not specified as traumatic: Secondary | ICD-10-CM

## 2016-06-28 DIAGNOSIS — M5412 Radiculopathy, cervical region: Secondary | ICD-10-CM | POA: Diagnosis not present

## 2016-06-28 LAB — POCT GLYCOSYLATED HEMOGLOBIN (HGB A1C): HEMOGLOBIN A1C: 6.2

## 2016-06-28 MED ORDER — GABAPENTIN 100 MG PO CAPS: 200.0000 mg | ORAL_CAPSULE | Freq: Every day | ORAL | 3 refills | Status: DC

## 2016-06-28 NOTE — Assessment & Plan Note (Signed)
Patient given injection today and tolerated the procedure well. Patient will monitor blood sugars. Differential does include cervical radiculopathy and started on gabapentin. We discussed icing regimen and home exercises. We discussed which activities to do in which ones to potentially avoid. Patient will continue to stay active. Patient follow-up with me again in 4 weeks to be worsening symptoms or weakness we may need advance imaging including MRI. Patient is artery done physical therapy and failed all other conservative therapy. Exercises that giving again today.

## 2016-06-28 NOTE — Assessment & Plan Note (Signed)
Positive spurlings. But likely shoulder Xray today patient work on posture. Started gabapentin at night to see if this will be beneficial. Follow-up again in 3-4 weeks. We'll call if any significant worsening symptoms. Significant medical attention if any weakness or numbness occurs.

## 2016-06-28 NOTE — Patient Instructions (Addendum)
Good to see you.  Ice 20 minutes 2 times daily. Usually after activity and before bed. Exercises 3 times a week.  Get xray downstairs We injected the shoulder to see how much this is playing a role Gabapentin 200mg  at night Tramadol for the breakthrough pain.  Send me a message on Monday and if not better we will order MRI.

## 2016-06-28 NOTE — Addendum Note (Signed)
Addended by: Bobbye RiggsWALKER, LISA L on: 06/28/2016 02:30 PM   Modules accepted: Orders

## 2016-06-28 NOTE — Addendum Note (Signed)
Addended by: Edwena FeltyARSON, Jionni Helming T on: 06/28/2016 08:41 AM   Modules accepted: Orders

## 2016-07-02 ENCOUNTER — Encounter: Payer: Self-pay | Admitting: Family

## 2016-07-11 ENCOUNTER — Encounter: Payer: Self-pay | Admitting: Family

## 2016-07-12 ENCOUNTER — Other Ambulatory Visit: Payer: Self-pay

## 2016-07-12 DIAGNOSIS — M75101 Unspecified rotator cuff tear or rupture of right shoulder, not specified as traumatic: Secondary | ICD-10-CM

## 2016-07-12 NOTE — Telephone Encounter (Signed)
Pt is requesting to be worked in today for shoulder pain.

## 2016-07-14 ENCOUNTER — Other Ambulatory Visit: Payer: Self-pay | Admitting: Endocrinology

## 2016-07-25 ENCOUNTER — Ambulatory Visit
Admission: RE | Admit: 2016-07-25 | Discharge: 2016-07-25 | Disposition: A | Discharge status: Home / Self Care | Payer: Managed Care, Other (non HMO) | Source: Ambulatory Visit | Attending: Family Medicine | Admitting: Family Medicine

## 2016-07-25 DIAGNOSIS — M75101 Unspecified rotator cuff tear or rupture of right shoulder, not specified as traumatic: Secondary | ICD-10-CM

## 2016-07-25 MED ORDER — IOPAMIDOL (ISOVUE-M 200) INJECTION 41%
12.0000 mL | Freq: Once | INTRAMUSCULAR | Status: AC
  Administered 2016-07-25: 12 mL via INTRA_ARTICULAR

## 2016-08-20 ENCOUNTER — Other Ambulatory Visit: Payer: Self-pay | Admitting: Endocrinology

## 2016-09-15 ENCOUNTER — Other Ambulatory Visit: Payer: Self-pay | Admitting: Endocrinology

## 2016-09-18 ENCOUNTER — Telehealth: Payer: Self-pay | Admitting: Endocrinology

## 2016-09-18 ENCOUNTER — Other Ambulatory Visit: Payer: Self-pay | Admitting: Endocrinology

## 2016-09-18 NOTE — Telephone Encounter (Signed)
Pt called in and requested a [facility] [address]

## 2016-09-19 MED ORDER — DULAGLUTIDE 1.5 MG/0.5ML ~~LOC~~ SOAJ: SUBCUTANEOUS | 2 refills | Status: DC

## 2016-09-19 NOTE — Telephone Encounter (Signed)
Refill submitted. 

## 2016-10-05 ENCOUNTER — Other Ambulatory Visit: Payer: Self-pay | Admitting: Family

## 2016-10-05 NOTE — Telephone Encounter (Signed)
Pt see Dr. Lucianne MussKumar for her diabetes forwarding refill request to endo...Raechel Chute/lmb

## 2016-10-16 ENCOUNTER — Other Ambulatory Visit: Payer: Self-pay | Admitting: Endocrinology

## 2016-10-19 ENCOUNTER — Other Ambulatory Visit: Payer: Managed Care, Other (non HMO)

## 2016-10-26 ENCOUNTER — Ambulatory Visit: Payer: Managed Care, Other (non HMO) | Admitting: Endocrinology

## 2016-10-30 ENCOUNTER — Other Ambulatory Visit (INDEPENDENT_AMBULATORY_CARE_PROVIDER_SITE_OTHER): Payer: Managed Care, Other (non HMO)

## 2016-10-30 DIAGNOSIS — E1165 Type 2 diabetes mellitus with hyperglycemia: Secondary | ICD-10-CM | POA: Diagnosis not present

## 2016-10-30 LAB — COMPREHENSIVE METABOLIC PANEL
ALT: 32 U/L (ref 0–35)
AST: 21 U/L (ref 0–37)
Albumin: 4.4 g/dL (ref 3.5–5.2)
Alkaline Phosphatase: 48 U/L (ref 39–117)
BILIRUBIN TOTAL: 0.4 mg/dL (ref 0.2–1.2)
BUN: 17 mg/dL (ref 6–23)
CALCIUM: 10.1 mg/dL (ref 8.4–10.5)
CHLORIDE: 99 meq/L (ref 96–112)
CO2: 33 meq/L — AB (ref 19–32)
CREATININE: 0.8 mg/dL (ref 0.40–1.20)
GFR: 93.54 mL/min (ref >60.00)
GLUCOSE: 160 mg/dL — AB (ref 70–99)
Potassium: 3.9 mEq/L (ref 3.5–5.1)
Sodium: 140 mEq/L (ref 135–145)
Total Protein: 7.3 g/dL (ref 6.0–8.3)

## 2016-10-30 LAB — MICROALBUMIN / CREATININE URINE RATIO
Creatinine,U: 145.4 mg/dL
MICROALB/CREAT RATIO: 3.6 mg/g (ref 0.0–30.0)
Microalb, Ur: 5.3 mg/dL — ABNORMAL HIGH (ref 0.0–1.9)

## 2016-10-30 LAB — LIPID PANEL
Cholesterol: 190 mg/dL (ref 0–200)
HDL: 54.8 mg/dL (ref >39.00)
NonHDL: 135.61
TRIGLYCERIDES: 285 mg/dL — AB (ref 0.0–149.0)
Total CHOL/HDL Ratio: 3
VLDL: 57 mg/dL — AB (ref 0.0–40.0)

## 2016-10-30 LAB — LDL CHOLESTEROL, DIRECT: LDL DIRECT: 84 mg/dL

## 2016-10-30 LAB — HEMOGLOBIN A1C: HEMOGLOBIN A1C: 6.8 % — AB (ref 4.6–6.5)

## 2016-11-02 ENCOUNTER — Ambulatory Visit: Payer: Managed Care, Other (non HMO) | Admitting: Endocrinology

## 2016-11-05 NOTE — Progress Notes (Signed)
Patient ID: Terry Hull, female   DOB: 1955/10/21, 61 y.o.   MRN: [id number]           Reason for Appointment: Follow-up for Type 2 Diabetes   History of Present Illness:          Date of diagnosis of type 2 diabetes mellitus:   2013       Background history:   Patient had been complaining of fatigue at the time of diagnosis and lab glucose indicated her level was 453 She was initially treated with metformin but she said that since this made her sleepy she did not continue this Subsequently he has been on the variety of medications including Januvia which she is still taking She thinks she had fairly good control with Actos 45 mg but was stopped because of weight gain in 11/2013 At that time she was started on Victoza Her blood sugars were significantly high prior to her initial consultation. On her initial visit she was started on metformin  Recent history:   She has been on Trulicity 1.5 mg since 2/17  Her A1c has gone up to 6.8, previously as low as 6.2  Current blood sugar patterns and problems identified:   She has not checked her blood sugars as she cannot afford test strips  She says that blood sugars are high from stress  She claims that she cannot find any time for exercise; weight has gone up 2 pounds  Her lab nonfasting glucose was 160  Continues to be regular  Trulicity, taking 1.5 mg  Overall she thinks she is watching her diet   Hypoglycemic drugs the patient is taking are: Metformin ER 2000 mg a day     Side effects from medications have been:Weight gain form Actos   Compliance with the medical regimen: Fair  Glucose monitoring:  done usually 1-2 times a day         Glucometer: One Touch.      Blood Glucose readings not available   Self-care:  Typical meal intake: Breakfast is egg White omelette with vegetables, lunch is a salad, dinner is a sandwich and has snacks with food.   Avoiding drinks with sugar                Dietician visit, most  recent: 01/21/15               Exercise: None recently  Weight history: Previous range 145-201  Wt Readings from Last 3 Encounters:  11/06/16 178 lb 3.2 oz (80.8 kg)  06/28/16 176 lb (79.8 kg)  06/27/16 179 lb (81.2 kg)    Glycemic control:   Lab Results  Component Value Date   HGBA1C 6.8 (H) 10/30/2016   HGBA1C 6.2 06/28/2016   HGBA1C 6.4 01/18/2016   Lab Results  Component Value Date   MICROALBUR 5.3 (H) 10/30/2016   LDLCALC 82 03/07/2015   CREATININE 0.80 10/30/2016    No visits with results within 1 Week(s) from this visit.  Latest known visit with results is:  Lab on 10/30/2016  Component Date Value Ref Range Status  . Hgb A1c MFr Bld 10/30/2016 6.8* 4.6 - 6.5 % Final   Glycemic Control Guidelines for People with Diabetes:Non Diabetic:  <6%Goal of Therapy: <7%Additional Action Suggested:  >8%   . Sodium 10/30/2016 140  135 - 145 mEq/L Final  . Potassium 10/30/2016 3.9  3.5 - 5.1 mEq/L Final  . Chloride 10/30/2016 99  96 - 112 mEq/L Final  . CO2  10/30/2016 33* 19 - 32 mEq/L Final  . Glucose, Bld 10/30/2016 160* 70 - 99 mg/dL Final  . BUN 16/01/9603 17  6 - 23 mg/dL Final  . Creatinine, Ser 10/30/2016 0.80  0.40 - 1.20 mg/dL Final  . Total Bilirubin 10/30/2016 0.4  0.2 - 1.2 mg/dL Final  . Alkaline Phosphatase 10/30/2016 48  39 - 117 U/L Final  . AST 10/30/2016 21  0 - 37 U/L Final  . ALT 10/30/2016 32  0 - 35 U/L Final  . Total Protein 10/30/2016 7.3  6.0 - 8.3 g/dL Final  . Albumin 54/12/8117 4.4  3.5 - 5.2 g/dL Final  . Calcium 14/78/2956 10.1  8.4 - 10.5 mg/dL Final  . GFR 21/30/8657 93.54  >60.00 mL/min Final  . Microalb, Ur 10/30/2016 5.3* 0.0 - 1.9 mg/dL Final  . Creatinine,U 84/69/6295 145.4  mg/dL Final  . Microalb Creat Ratio 10/30/2016 3.6  0.0 - 30.0 mg/g Final  . Cholesterol 10/30/2016 190  0 - 200 mg/dL Final   ATP III Classification       Desirable:  < 200 mg/dL               Borderline High:  200 - 239 mg/dL          High:  > = 284 mg/dL  .  Triglycerides 10/30/2016 285.0* 0.0 - 149.0 mg/dL Final   Normal:  <132 mg/dLBorderline High:  150 - 199 mg/dL  . HDL 10/30/2016 54.80  >39.00 mg/dL Final  . VLDL 44/04/270 57.0* 0.0 - 40.0 mg/dL Final  . Total CHOL/HDL Ratio 10/30/2016 3   Final                  Men          Women1/2 Average Risk     3.4          3.3Average Risk          5.0          4.42X Average Risk          9.6          7.13X Average Risk          15.0          11.0                      . NonHDL 10/30/2016 135.61   Final   NOTE:  Non-HDL goal should be 30 mg/dL higher than patient's LDL goal (i.e. LDL goal of < 70 mg/dL, would have non-HDL goal of < 100 mg/dL)  . Direct LDL 10/30/2016 84.0  mg/dL Final   Optimal:  <536 mg/dLNear or Above Optimal:  100-129 mg/dLBorderline High:  130-159 mg/dLHigh:  160-189 mg/dLVery High:  >190 mg/dL      Allergies as of 6/44/0347      Reactions   Oxycodone       Medication List       Accurate as of 11/06/16  8:37 AM. Always use your most recent med list.          albuterol 108 (90 Base) MCG/ACT inhaler Commonly known as:  PROVENTIL HFA;VENTOLIN HFA Inhale 2 puffs into the lungs every 6 (six) hours as needed for wheezing or shortness of breath.   amLODipine 5 MG tablet Commonly known as:  NORVASC Take 1 tablet (5 mg total) by mouth daily.   carvedilol 6.25 MG tablet Commonly known as:  COREG TAKE 1 TABLET BY MOUTH TWICE  DAILY WITH MEALS   citalopram 20 MG tablet Commonly known as:  CELEXA TAKE 1 TABLET BY MOUTH DAILY   Dulaglutide 1.5 MG/0.5ML Sopn Commonly known as:  TRULICITY INJECT UNDER THE SKIN WEEKLY AS DIRECTED   fluticasone 50 MCG/ACT nasal spray Commonly known as:  FLONASE Place 1 spray into both nostrils daily.   gabapentin 100 MG capsule Commonly known as:  NEURONTIN Take 2 capsules (200 mg total) by mouth at bedtime.   hydrochlorothiazide 12.5 MG capsule Commonly known as:  MICROZIDE TAKE 1 CAPSULE(12.5 MG) BY MOUTH DAILY   Insulin Pen Needle  31G X 5 MM Misc Use to inject insulin twice per day as directed with rx for bByetta. DX E11.09   lisinopril 40 MG tablet Commonly known as:  PRINIVIL,ZESTRIL TAKE 1 TABLET(40 MG) BY MOUTH DAILY   metFORMIN 500 MG 24 hr tablet Commonly known as:  GLUCOPHAGE-XR TAKE 4 TABLETS(2000 MG) BY MOUTH DAILY WITH SUPPER   ONE TOUCH LANCETS Misc Use to help check blood sugar twice a day   ONE TOUCH ULTRA TEST test strip Generic drug:  glucose blood CHECK BLOOD SUGAR BEFORE BREAKFAST AND AT BEDTIME   pravastatin 20 MG tablet Commonly known as:  PRAVACHOL TAKE 1 TABLET BY MOUTH DAILY       Allergies:  Allergies  Allergen Reactions  . Oxycodone     Past Medical History:  Diagnosis Date  . Abnormal electrocardiogram    consistent with LVH hypertrophy  . ALLERGIC RHINITIS   . ARTHRITIS   . DIABETES MELLITUS, TYPE II    follows with endo  . HYPERTENSION   . HYPERTRIGLYCERIDEMIA   . SLEEP APNEA     Past Surgical History:  Procedure Laterality Date  . ABDOMINAL HYSTERECTOMY    . HAND TENDON SURGERY    . NASAL POLYP SURGERY      Family History  Problem Relation Age of Onset  . Cancer Mother        Ovarian Cancer  . Hyperlipidemia Mother   . Hypertension Mother   . Diabetes Other        1st degree relative    Social History:  reports that she quit smoking about 3 years ago. She smoked 1.00 pack per day. She has never used smokeless tobacco. She reports that she drinks alcohol. She reports that she does not use drugs.    Review of Systems    Lipid history: On pravastatin for several years, has had  high triglycerides also, Labs were nonfasting    Lab Results  Component Value Date   CHOL 190 10/30/2016   HDL 54.80 10/30/2016   LDLCALC 82 03/07/2015   LDLDIRECT 84.0 10/30/2016   TRIG 285.0 (H) 10/30/2016   CHOLHDL 3 10/30/2016            Eyes:  She is due for her exam again  Hypertension: Treated with medications for 6-7 years, On 40 mg lisinopril, amlodipine  and HCTZ; followed by PCP annually  Home BP about 120/90  BP Readings from Last 3 Encounters:  11/06/16 (!) 160/90  06/28/16 132/86  06/27/16 (!) 144/70      LABS:  No visits with results within 1 Week(s) from this visit.  Latest known visit with results is:  Lab on 10/30/2016  Component Date Value Ref Range Status  . Hgb A1c MFr Bld 10/30/2016 6.8* 4.6 - 6.5 % Final   Glycemic Control Guidelines for People with Diabetes:Non Diabetic:  <6%Goal of Therapy: <7%Additional Action Suggested:  >8%   .  Sodium 10/30/2016 140  135 - 145 mEq/L Final  . Potassium 10/30/2016 3.9  3.5 - 5.1 mEq/L Final  . Chloride 10/30/2016 99  96 - 112 mEq/L Final  . CO2 10/30/2016 33* 19 - 32 mEq/L Final  . Glucose, Bld 10/30/2016 160* 70 - 99 mg/dL Final  . BUN 16/01/9603 17  6 - 23 mg/dL Final  . Creatinine, Ser 10/30/2016 0.80  0.40 - 1.20 mg/dL Final  . Total Bilirubin 10/30/2016 0.4  0.2 - 1.2 mg/dL Final  . Alkaline Phosphatase 10/30/2016 48  39 - 117 U/L Final  . AST 10/30/2016 21  0 - 37 U/L Final  . ALT 10/30/2016 32  0 - 35 U/L Final  . Total Protein 10/30/2016 7.3  6.0 - 8.3 g/dL Final  . Albumin 54/12/8117 4.4  3.5 - 5.2 g/dL Final  . Calcium 14/78/2956 10.1  8.4 - 10.5 mg/dL Final  . GFR 21/30/8657 93.54  >60.00 mL/min Final  . Microalb, Ur 10/30/2016 5.3* 0.0 - 1.9 mg/dL Final  . Creatinine,U 84/69/6295 145.4  mg/dL Final  . Microalb Creat Ratio 10/30/2016 3.6  0.0 - 30.0 mg/g Final  . Cholesterol 10/30/2016 190  0 - 200 mg/dL Final   ATP III Classification       Desirable:  < 200 mg/dL               Borderline High:  200 - 239 mg/dL          High:  > = 284 mg/dL  . Triglycerides 10/30/2016 285.0* 0.0 - 149.0 mg/dL Final   Normal:  <132 mg/dLBorderline High:  150 - 199 mg/dL  . HDL 10/30/2016 54.80  >39.00 mg/dL Final  . VLDL 44/04/270 57.0* 0.0 - 40.0 mg/dL Final  . Total CHOL/HDL Ratio 10/30/2016 3   Final                  Men          Women1/2 Average Risk     3.4           3.3Average Risk          5.0          4.42X Average Risk          9.6          7.13X Average Risk          15.0          11.0                      . NonHDL 10/30/2016 135.61   Final   NOTE:  Non-HDL goal should be 30 mg/dL higher than patient's LDL goal (i.e. LDL goal of < 70 mg/dL, would have non-HDL goal of < 100 mg/dL)  . Direct LDL 10/30/2016 84.0  mg/dL Final   Optimal:  <536 mg/dLNear or Above Optimal:  100-129 mg/dLBorderline High:  130-159 mg/dLHigh:  160-189 mg/dLVery High:  >190 mg/dL    Physical Examination:  BP (!) 160/90   Pulse 67   Ht 5' 5.5" (1.664 m)   Wt 178 lb 3.2 oz (80.8 kg)   SpO2 95%   BMI 29.20 kg/m     Diabetic Foot Exam - Simple   Simple Foot Form Diabetic Foot exam was performed with the following findings:  Yes   Visual Inspection No deformities, no ulcerations, no other skin breakdown bilaterally:  Yes Sensation Testing Intact to touch and monofilament testing bilaterally:  Yes  Pulse Check Posterior Tibialis and Dorsalis pulse intact bilaterally:  Yes Comments        ASSESSMENT:  Diabetes type 2,  with obesity  See history of present illness for detailed discussion of  current management, blood sugar patterns and problems identified  A1c is now 6.8, previously 6.2  She has not done well since her last visit and she claims that this is because of stress She does not take the time to exercise and has not been motivated to check her sugar; previously has tended to have higher fasting readings And has good control using metformin and Trulicity with no side effects She has not exercised as much  She does need to check more readings after meals as she is checking sporadically only in the mornings She tends to have relatively high fasting readings however  HYPERTENSION: Blood pressure is still not controlled and she is not aware of blood pressure targets  PLAN:   Continue Trulicity 1.5 mg weekly with metformin.    Encourage her to start using  exercise video at home  She can start checking blood sugars with the [employer] monitor, discussed timing and targets of blood sugar readings  Increase amlodipine to 10 mg  Check blood pressure regularly at home and follow-up with PCP  Follow-up in 4 months   Patient Instructions  Exercise video  [employer]  Check blood sugars on waking up  2/7  Also check blood sugars about 2 hours after a meal and do this after different meals by rotation  Recommended blood sugar levels on waking up is 90-130 and about 2 hours after meal is 130-160  Please bring your blood sugar monitor to each visit, thank you  Amlodipine 2 of the 5 mg   Mieka Leaton 11/06/2016, 8:37 AM   Note: This office note was prepared with Insurance underwriter. Any transcriptional errors that result from this process are unintentional.

## 2016-11-06 ENCOUNTER — Encounter: Payer: Self-pay | Admitting: Endocrinology

## 2016-11-06 ENCOUNTER — Ambulatory Visit (INDEPENDENT_AMBULATORY_CARE_PROVIDER_SITE_OTHER): Payer: Managed Care, Other (non HMO) | Admitting: Endocrinology

## 2016-11-06 VITALS — BP 160/90 | HR 67 | Ht 65.5 in | Wt 178.2 lb

## 2016-11-06 DIAGNOSIS — I10 Essential (primary) hypertension: Secondary | ICD-10-CM

## 2016-11-06 DIAGNOSIS — Z794 Long term (current) use of insulin: Secondary | ICD-10-CM

## 2016-11-06 DIAGNOSIS — E1165 Type 2 diabetes mellitus with hyperglycemia: Secondary | ICD-10-CM

## 2016-11-06 MED ORDER — AMLODIPINE BESYLATE 10 MG PO TABS: 10.0000 mg | ORAL_TABLET | Freq: Every day | ORAL | 1 refills | Status: DC

## 2016-11-06 NOTE — Patient Instructions (Addendum)
Exercise video  [employer]  Check blood sugars on waking up  2/7  Also check blood sugars about 2 hours after a meal and do this after different meals by rotation  Recommended blood sugar levels on waking up is 90-130 and about 2 hours after meal is 130-160  Please bring your blood sugar monitor to each visit, thank you  Amlodipine 2 of the 5 mg

## 2016-11-08 ENCOUNTER — Other Ambulatory Visit: Payer: Self-pay | Admitting: Internal Medicine

## 2016-11-15 ENCOUNTER — Other Ambulatory Visit: Payer: Self-pay | Admitting: Endocrinology

## 2016-11-22 ENCOUNTER — Other Ambulatory Visit: Payer: Self-pay | Admitting: Endocrinology

## 2016-11-22 ENCOUNTER — Other Ambulatory Visit: Payer: Self-pay | Admitting: Internal Medicine

## 2016-12-09 ENCOUNTER — Other Ambulatory Visit: Payer: Self-pay | Admitting: Endocrinology

## 2017-01-06 ENCOUNTER — Other Ambulatory Visit: Payer: Self-pay | Admitting: Endocrinology

## 2017-01-14 ENCOUNTER — Other Ambulatory Visit: Payer: Self-pay | Admitting: Endocrinology

## 2017-01-30 ENCOUNTER — Encounter: Payer: Self-pay | Admitting: Internal Medicine

## 2017-01-30 ENCOUNTER — Ambulatory Visit (INDEPENDENT_AMBULATORY_CARE_PROVIDER_SITE_OTHER): Payer: Managed Care, Other (non HMO) | Admitting: Internal Medicine

## 2017-01-30 VITALS — BP 150/82 | HR 72 | Temp 98.6°F | Resp 16 | Wt 178.0 lb

## 2017-01-30 DIAGNOSIS — G479 Sleep disorder, unspecified: Secondary | ICD-10-CM

## 2017-01-30 DIAGNOSIS — F4321 Adjustment disorder with depressed mood: Secondary | ICD-10-CM

## 2017-01-30 DIAGNOSIS — F419 Anxiety disorder, unspecified: Secondary | ICD-10-CM

## 2017-01-30 DIAGNOSIS — I10 Essential (primary) hypertension: Secondary | ICD-10-CM

## 2017-01-30 DIAGNOSIS — E1165 Type 2 diabetes mellitus with hyperglycemia: Secondary | ICD-10-CM

## 2017-01-30 MED ORDER — HYDROCHLOROTHIAZIDE 12.5 MG PO CAPS: 25.0000 mg | ORAL_CAPSULE | Freq: Every day | ORAL | 3 refills | Status: DC

## 2017-01-30 NOTE — Assessment & Plan Note (Signed)
Uncontrolled Her medication was recently adjusted by endocrine and her blood pressure is still elevated Increase hydrochlorothiazide to 25 mg daily Continue all other medications at current doses She will continue to monitor her blood pressure closely at home and call if there is no improvement Follow-up in 4 weeks, sooner if needed I do recommend that she remain out of work until her blood pressure is better controlled and her other chronic medical conditions are controlled as well

## 2017-01-30 NOTE — Assessment & Plan Note (Signed)
Sudden loss of her son-heart attack Going through the natural grieving process Good social support Declined any referral for therapy and does not wish to make any medication changes at this time

## 2017-01-30 NOTE — Assessment & Plan Note (Signed)
Management per Dr. Lucianne Muss  Lab Results  Component Value Date   HGBA1C 6.8 (H) 10/30/2016

## 2017-01-30 NOTE — Assessment & Plan Note (Addendum)
Did not tolerate trazodone - caused a cough Advised her to avoid sleeping during the day Try Melatonin May need short course of benzodiazepines

## 2017-01-30 NOTE — Progress Notes (Signed)
Subjective:    Patient ID: Terry Hull, female    DOB: 06-03-1955, 61 y.o.   MRN: [id number]  HPI She is here for an acute visit.   Her son died suddenly of a massive heart attack three weeks ago (12/25/16).  She works at a [employer] and it is stressful.  Her manager would like her to go on short term disability.  She did return to work soon after his death and it is becoming harder for her to work for several reasons. She is having difficulty dealing with his death. She is experiencing palpitations, shortness of breath, decreased appetite and difficulty sleeping. Her blood pressure is not controlled and her sugars have not been controlled, which may also be causing some of her symptoms.  She is unsure what her company will give her as far as time off.  She went of work 01/23/17 and only worked 4 hrs and has not been back since.    Hypertension: She is taking her medication daily. She is compliant with a low sodium diet.  She has had some palpitations, edema, headaches and shortness of breath.  She denies chest pain.  She does monitor her blood pressure at home - 200/101 and 225/151 was the highest.  Prior to this it was controlled.    She is following with endocrine for her diabetes. Her sugars have not been controlled as well as the use to. Her medication was adjusted and her sugars are starting to improve.  Of course her stress level and depression are there. She is taking her medication daily as prescribed. She has a good social support system and does not feel that she needs more medication at this time.  Medications and allergies reviewed with patient and updated if appropriate.  Patient Active Problem List   Diagnosis Date Noted  . Nontraumatic partial bilateral rotator cuff tear 06/28/2016  . Cervical radiculopathy 06/28/2016  . Cervical paraspinal muscle spasm 06/18/2016  . Infective otitis externa of right ear 05/14/2016  . Acute non-recurrent maxillary sinusitis  05/14/2016  . Sleep difficulties 03/09/2016  . Hyperlipidemia 03/09/2016  . Type 2 diabetes mellitus with hyperglycemia, without long-term current use of insulin (HCC) 03/07/2015  . Anxiety 05/29/2010  . Essential hypertension 02/28/2010  . Allergic rhinitis 02/28/2010  . Arthritis 02/28/2010  . SLEEP APNEA 02/08/2010  . ATHEROSLERO NATV ART EXTREM W/INTERMIT CLAUDICAT 02/07/2010    Current Outpatient Prescriptions on File Prior to Visit  Medication Sig Dispense Refill  . albuterol (PROVENTIL HFA;VENTOLIN HFA) 108 (90 BASE) MCG/ACT inhaler Inhale 2 puffs into the lungs every 6 (six) hours as needed for wheezing or shortness of breath. 1 Inhaler 2  . amLODipine (NORVASC) 10 MG tablet TAKE 1 TABLET(10 MG) BY MOUTH DAILY 30 tablet 0  . carvedilol (COREG) 6.25 MG tablet TAKE 1 TABLET BY MOUTH TWICE DAILY WITH MEALS 60 tablet 1  . citalopram (CELEXA) 20 MG tablet TAKE 1 TABLET BY MOUTH DAILY 90 tablet 0  . Dulaglutide (TRULICITY) 1.5 MG/0.5ML SOPN INJECT UNDER THE SKIN WEEKLY AS DIRECTED 4 pen 2  . fluticasone (FLONASE) 50 MCG/ACT nasal spray Place 1 spray into both nostrils daily. 16 g 2  . gabapentin (NEURONTIN) 100 MG capsule Take 2 capsules (200 mg total) by mouth at bedtime. 60 capsule 3  . hydrochlorothiazide (MICROZIDE) 12.5 MG capsule TAKE 1 CAPSULE(12.5 MG) BY MOUTH DAILY 90 capsule 3  . Insulin Pen Needle 31G X 5 MM MISC Use to inject insulin twice per day  as directed with rx for bByetta. DX E11.09 200 each 3  . lisinopril (PRINIVIL,ZESTRIL) 40 MG tablet TAKE 1 TABLET(40 MG) BY MOUTH DAILY 90 tablet 3  . metFORMIN (GLUCOPHAGE-XR) 500 MG 24 hr tablet TAKE 4 TABLETS(2000 MG) BY MOUTH DAILY WITH SUPPER 120 tablet 1  . ONE TOUCH LANCETS MISC Use to help check blood sugar twice a day 60 each 5  . ONE TOUCH ULTRA TEST test strip CHECK BLOOD SUGAR BEFORE BREAKFAST AND AT BEDTIME 100 each 0  . pravastatin (PRAVACHOL) 20 MG tablet TAKE 1 TABLET BY MOUTH DAILY 90 tablet 3  . TRULICITY 1.5  MG/0.5ML SOPN INJECT UNDER THE SKIN WEEKLY AS DIRECTED 2 mL 0   No current facility-administered medications on file prior to visit.     Past Medical History:  Diagnosis Date  . Abnormal electrocardiogram    consistent with LVH hypertrophy  . ALLERGIC RHINITIS   . ARTHRITIS   . DIABETES MELLITUS, TYPE II    follows with endo  . HYPERTENSION   . HYPERTRIGLYCERIDEMIA   . SLEEP APNEA     Past Surgical History:  Procedure Laterality Date  . ABDOMINAL HYSTERECTOMY    . HAND TENDON SURGERY    . NASAL POLYP SURGERY      Social History   Social History  . Marital status: Married    Spouse name: N/A  . Number of children: 1  . Years of education: N/A   Occupational History  . [employer]   Social History Main Topics  . Smoking status: Former Smoker    Packs/day: 1.00    Quit date: 12/29/2012  . Smokeless tobacco: Never Used  . Alcohol use Yes     Comment: 4 per week  . Drug use: No  . Sexual activity: Not Asked   Other Topics Concern  . None   Social History Narrative   She has 1 child and 3 grandchildren   She works at [employer]          Family History  Problem Relation Age of Onset  . Cancer Mother        Ovarian Cancer  . Hyperlipidemia Mother   . Hypertension Mother   . Diabetes Other        1st degree relative    Review of Systems  Constitutional: Positive for appetite change (decreased) and diaphoresis (at night). Negative for fever.  Respiratory: Positive for cough (at night) and shortness of breath. Negative for wheezing.   Cardiovascular: Positive for palpitations and leg swelling (intermittent). Negative for chest pain.  Gastrointestinal: Positive for nausea. Negative for abdominal pain.  Genitourinary: Positive for dysuria.  Neurological: Positive for dizziness and headaches. Negative for light-headedness.  Psychiatric/Behavioral: Positive for sleep disturbance.       Objective:    Vitals:   01/30/17 1345  BP: (!) 150/82  Pulse: 72  Resp: 16  Temp: 98.6 F (37 C)  SpO2: 97%   Filed Weights   01/30/17 1345  Weight: 178 lb (80.7 kg)   Body mass index is 29.17 kg/m.  Wt Readings from Last 3 Encounters:  01/30/17 178 lb (80.7 kg)  11/06/16 178 lb 3.2 oz (80.8 kg)  06/28/16 176 lb (79.8 kg)     Physical Exam Constitutional: Appears well-developed and well-nourished. No distress.  HENT:  Head: Normocephalic and atraumatic.  Neck: Neck supple. No tracheal deviation present. No thyromegaly present.  No cervical lymphadenopathy Cardiovascular: Normal rate, regular rhythm and normal  heart sounds.   No murmur heard. No carotid bruit .  No edema Pulmonary/Chest: Effort normal and breath sounds normal. No respiratory distress. No has no wheezes. No rales.  Skin: Skin is warm and dry. Not diaphoretic.  Psychiatric: Depressed, anxious mood and affect. Behavior is normal.         Assessment & Plan:   See Problem List for Assessment and Plan of chronic medical problems.

## 2017-01-30 NOTE — Assessment & Plan Note (Signed)
Increased anxiety recently related to her son's death was unexpected She is experiencing palpitations, headaches, difficulty sleeping and no appetite She is taking her citalopram daily as prescribed and does not want to make any changes at this time Has good social support Agree with taking her out of work until her anxiety/depression/grieving process improves and her hypertension is more controlled

## 2017-01-30 NOTE — Patient Instructions (Addendum)
  Medications reviewed and updated.  Changes include increasing the hydrochlorothiazide to 25 mg daily.     Please followup in 4 weeks

## 2017-01-31 ENCOUNTER — Encounter: Payer: Self-pay | Admitting: Internal Medicine

## 2017-02-04 ENCOUNTER — Telehealth: Payer: Self-pay | Admitting: Internal Medicine

## 2017-02-04 NOTE — Telephone Encounter (Addendum)
Patient called to follow up on FMLA forms. We have received the forms via fax. She states they need to be completed today. Forms have been started, need Dr. Lawerance Bach to approve the reason and the amount of time out.

## 2017-02-07 DIAGNOSIS — Z0279 Encounter for issue of other medical certificate: Secondary | ICD-10-CM

## 2017-02-07 NOTE — Telephone Encounter (Signed)
Forms completed&signed, Faxed, sent to scan &charged for. Original to patient.

## 2017-02-14 ENCOUNTER — Other Ambulatory Visit: Payer: Self-pay | Admitting: Endocrinology

## 2017-02-14 ENCOUNTER — Other Ambulatory Visit: Payer: Self-pay | Admitting: Internal Medicine

## 2017-02-27 ENCOUNTER — Ambulatory Visit (INDEPENDENT_AMBULATORY_CARE_PROVIDER_SITE_OTHER): Payer: Managed Care, Other (non HMO) | Admitting: Internal Medicine

## 2017-02-27 VITALS — BP 164/78 | HR 66 | Temp 98.3°F | Resp 16 | Wt 176.0 lb

## 2017-02-27 DIAGNOSIS — F4321 Adjustment disorder with depressed mood: Secondary | ICD-10-CM

## 2017-02-27 DIAGNOSIS — I10 Essential (primary) hypertension: Secondary | ICD-10-CM

## 2017-02-27 MED ORDER — HYDROCHLOROTHIAZIDE 25 MG PO TABS: 25.0000 mg | ORAL_TABLET | Freq: Every day | ORAL | 3 refills | Status: DC

## 2017-02-27 NOTE — Patient Instructions (Addendum)
Monitor your BP at home - it needs to be less than 140/90.  Start exercising.    Medications reviewed and updated.  No changes recommended at this time.   Please followup in 4 weeks

## 2017-02-27 NOTE — Assessment & Plan Note (Signed)
Uncontrolled She is very resistant to change her medication - she wants to monitor for now and start exercising Stressed exercise Monitor BP daily - goal < 140/90 I do think she needs more medication Stressed the importance of getting bp controlled  Will stay out of work until BP is controlled

## 2017-02-27 NOTE — Assessment & Plan Note (Addendum)
Symptoms improved with celexa Continue current dose Has good social support, does not feel she needs to see a therapist F/u in 1 month

## 2017-02-27 NOTE — Progress Notes (Signed)
Subjective:    Patient ID: Terry Hull, female    DOB: [birth year], 61 y.o.   MRN: [id number]  HPI The patient is here for follow up.  Hypertension: She was here 2 weeks ago.  Her hydrochlorothiazide was increased to 25 mg daily.  She is taking all of her medications daily as prescribed.  She is compliant with a low-sodium diet.  She has been monitoring her blood pressure at home and it has been:   170-180/120.  She is not currently exercising.  She was very upset yesterday and feels that is why her blood pressure is elevated here today, but agrees her blood pressure has been elevated at home as well.  Depression: She is taking her medication daily as prescribed. She denies any side effects from the medication. She feels her depression is as controlled as it can be.  She is happy with her current dose of medication and does not feel it needs to be increased.  Of course, she is still struggling with the death of her son.  She has good days and bad days.  She does not feel she can return to work.   Medications and allergies reviewed with patient and updated if appropriate.  Patient Active Problem List   Diagnosis Date Noted  . Grieving 01/30/2017  . Nontraumatic partial bilateral rotator cuff tear 06/28/2016  . Cervical radiculopathy 06/28/2016  . Cervical paraspinal muscle spasm 06/18/2016  . Sleep difficulties 03/09/2016  . Hyperlipidemia 03/09/2016  . Type 2 diabetes mellitus with hyperglycemia, without long-term current use of insulin (HCC) 03/07/2015  . Anxiety 05/29/2010  . Uncontrolled hypertension 02/28/2010  . Allergic rhinitis 02/28/2010  . Arthritis 02/28/2010  . SLEEP APNEA 02/08/2010  . ATHEROSLERO NATV ART EXTREM W/INTERMIT CLAUDICAT 02/07/2010    Current Outpatient Prescriptions on File Prior to Visit  Medication Sig Dispense Refill  . albuterol (PROVENTIL HFA;VENTOLIN HFA) 108 (90 BASE) MCG/ACT inhaler Inhale 2 puffs into the lungs every 6 (six) hours as needed  for wheezing or shortness of breath. 1 Inhaler 2  . amLODipine (NORVASC) 10 MG tablet TAKE 1 TABLET(10 MG) BY MOUTH DAILY 30 tablet 0  . carvedilol (COREG) 6.25 MG tablet TAKE 1 TABLET BY MOUTH TWICE DAILY WITH MEALS 180 tablet 1  . citalopram (CELEXA) 20 MG tablet TAKE 1 TABLET BY MOUTH DAILY 90 tablet 1  . fluticasone (FLONASE) 50 MCG/ACT nasal spray Place 1 spray into both nostrils daily. 16 g 2  . Insulin Pen Needle 31G X 5 MM MISC Use to inject insulin twice per day as directed with rx for bByetta. DX E11.09 200 each 3  . lisinopril (PRINIVIL,ZESTRIL) 40 MG tablet TAKE 1 TABLET(40 MG) BY MOUTH DAILY 90 tablet 3  . metFORMIN (GLUCOPHAGE-XR) 500 MG 24 hr tablet TAKE 4 TABLETS(2000 MG) BY MOUTH DAILY WITH SUPPER 120 tablet 1  . ONE TOUCH LANCETS MISC Use to help check blood sugar twice a day 60 each 5  . ONE TOUCH ULTRA TEST test strip CHECK BLOOD SUGAR BEFORE BREAKFAST AND AT BEDTIME 100 each 0  . pravastatin (PRAVACHOL) 20 MG tablet TAKE 1 TABLET BY MOUTH DAILY 90 tablet 3  . TRULICITY 1.5 MG/0.5ML SOPN INJECT UNDER THE SKIN WEEKLY AS DIRECTED 2 mL 0   No current facility-administered medications on file prior to visit.     Past Medical History:  Diagnosis Date  . Abnormal electrocardiogram    consistent with LVH hypertrophy  . ALLERGIC RHINITIS   . ARTHRITIS   .  DIABETES MELLITUS, TYPE II    follows with endo  . HYPERTENSION   . HYPERTRIGLYCERIDEMIA   . SLEEP APNEA     Past Surgical History:  Procedure Laterality Date  . ABDOMINAL HYSTERECTOMY    . HAND TENDON SURGERY    . NASAL POLYP SURGERY      Social History   Social History  . Marital status: Married    Spouse name: N/A  . Number of children: 1  . Years of education: N/A   Occupational History  . [employer]   Social History Main Topics  . Smoking status: Former Smoker    Packs/day: 1.00    Quit date: 12/29/2012  . Smokeless tobacco: Never Used  . Alcohol use Yes      Comment: 4 per week  . Drug use: No  . Sexual activity: Not on file   Other Topics Concern  . Not on file   Social History Narrative   She has 1 child and 3 grandchildren   She works at [employer]          Family History  Problem Relation Age of Onset  . Cancer Mother        Ovarian Cancer  . Hyperlipidemia Mother   . Hypertension Mother   . Diabetes Other        1st degree relative    Review of Systems  Constitutional: Negative for chills and fever.  Respiratory: Negative for cough, shortness of breath and wheezing.   Cardiovascular: Positive for palpitations (with anxiety only). Negative for chest pain and leg swelling.  Neurological: Positive for headaches (occ). Negative for light-headedness.       Objective:   Vitals:   02/27/17 1336  BP: (!) 164/88  Pulse: 66  Resp: 16  Temp: 98.3 F (36.8 C)  SpO2: 97%   Wt Readings from Last 3 Encounters:  02/27/17 176 lb (79.8 kg)  01/30/17 178 lb (80.7 kg)  11/06/16 178 lb 3.2 oz (80.8 kg)   Body mass index is 28.84 kg/m.   Physical Exam    Constitutional: Appears well-developed and well-nourished. No distress.  HENT:  Head: Normocephalic and atraumatic.  Neck: Neck supple. No tracheal deviation present. No thyromegaly present.  No cervical lymphadenopathy Cardiovascular: Normal rate, regular rhythm and normal heart sounds.   No murmur heard. No carotid bruit .  No edema Pulmonary/Chest: Effort normal and breath sounds normal. No respiratory distress. No has no wheezes. No rales.  Skin: Skin is warm and dry. Not diaphoretic.  Psychiatric: Normal mood and affect. Behavior is normal.      Assessment & Plan:    See Problem List for Assessment and Plan of chronic medical problems.

## 2017-02-28 ENCOUNTER — Encounter: Payer: Self-pay | Admitting: Internal Medicine

## 2017-03-01 ENCOUNTER — Telehealth: Payer: Self-pay | Admitting: Internal Medicine

## 2017-03-01 NOTE — Telephone Encounter (Signed)
Patient called to follow up on [employer] forms being set off. It was information requesting medical records. That information has been sent to medical records. Called patient back to inform her of this information.   [employer] [phone] ex: [phone]

## 2017-03-04 ENCOUNTER — Encounter: Payer: Self-pay | Admitting: Endocrinology

## 2017-03-05 ENCOUNTER — Other Ambulatory Visit: Payer: Managed Care, Other (non HMO)

## 2017-03-06 ENCOUNTER — Other Ambulatory Visit: Payer: Self-pay | Admitting: Internal Medicine

## 2017-03-08 ENCOUNTER — Ambulatory Visit: Payer: Managed Care, Other (non HMO) | Admitting: Endocrinology

## 2017-03-08 DIAGNOSIS — Z0289 Encounter for other administrative examinations: Secondary | ICD-10-CM

## 2017-03-12 NOTE — Telephone Encounter (Signed)
Pt called and Demetrius from [employer] has been trying you reach you to receive verbals information regarding the patient, The patient said he has been trying several times and Demetrius has informed her her claim has been closed. She would like for you to call him   [phone] Ext [phone]  I also transferred her to Medical Records to get the update on her request

## 2017-03-12 NOTE — Telephone Encounter (Signed)
Spoke with Terry Hull. She states [employer] needs to speak with us about some things, he had some questions. She states she is also waiting a called back from medical records to find out what is going on with that part.  ° °I have called [employer] and left Terry Hull a VM to call back and see what kind of information he is needing. I informed him I will be at another location today at 1. If Terry Hull is able she most likely can answer his questions when he calls.  °

## 2017-03-20 ENCOUNTER — Telehealth: Payer: Self-pay | Admitting: Internal Medicine

## 2017-03-20 NOTE — Telephone Encounter (Signed)
States employer is requesting a doctors notes stating that it is ok for patient to be out of work through the month of December.  Wants this faxed to her employer at [phone]. Attn:  Audelia ActonStephanie Roger.

## 2017-03-22 ENCOUNTER — Other Ambulatory Visit: Payer: Self-pay | Admitting: Endocrinology

## 2017-03-27 ENCOUNTER — Encounter: Payer: Self-pay | Admitting: Emergency Medicine

## 2017-03-27 ENCOUNTER — Other Ambulatory Visit: Payer: Self-pay | Admitting: Emergency Medicine

## 2017-03-27 MED ORDER — HYDROCHLOROTHIAZIDE 25 MG PO TABS: 25.0000 mg | ORAL_TABLET | Freq: Every day | ORAL | 3 refills | Status: DC

## 2017-03-27 NOTE — Telephone Encounter (Signed)
OV, as well as, letter excusing pt from work was faxed to number listed below.

## 2017-04-01 ENCOUNTER — Ambulatory Visit: Payer: Managed Care, Other (non HMO) | Admitting: Internal Medicine

## 2017-04-19 ENCOUNTER — Telehealth: Payer: Self-pay | Admitting: Endocrinology

## 2017-04-19 NOTE — Telephone Encounter (Signed)
Please advise if okay to fill these prescriptions? No showed last appointment.

## 2017-04-19 NOTE — Telephone Encounter (Signed)
Will be refilled when she makes an appointment, refuses

## 2017-04-19 NOTE — Telephone Encounter (Signed)
These prescriptions have been refused with a note for patient to make an appointment for further refills per Dr. Lucianne MussKumar.

## 2017-04-24 ENCOUNTER — Other Ambulatory Visit: Payer: Self-pay | Admitting: Endocrinology

## 2017-04-24 ENCOUNTER — Telehealth: Payer: Self-pay | Admitting: Internal Medicine

## 2017-04-24 ENCOUNTER — Other Ambulatory Visit: Payer: Self-pay

## 2017-04-24 ENCOUNTER — Telehealth: Payer: Self-pay | Admitting: Endocrinology

## 2017-04-24 MED ORDER — METFORMIN HCL ER 500 MG PO TB24: ORAL_TABLET | ORAL | 0 refills | Status: DC

## 2017-04-24 MED ORDER — DULAGLUTIDE 1.5 MG/0.5ML ~~LOC~~ SOAJ: SUBCUTANEOUS | 0 refills | Status: DC

## 2017-04-24 NOTE — Telephone Encounter (Signed)
Pt no showed her appt in November due to her son passing away. Pt has rescheduled for February but is completely out of Trulicity and Metformin. She is asking for this to be called in to [facility] [phone] if you will allow. Thank you!

## 2017-04-24 NOTE — Telephone Encounter (Signed)
Copied from CRM [phone]. Topic: Quick Communication - Rx Refill/Question >> Apr 24, 2017  9:30 AM Louie BunPalacios Medina, Rosey Batheresa D wrote: Has the patient contacted their pharmacy? Yes (Agent: If no, request that the patient contact the pharmacy for the refill.) Preferred Pharmacy (with phone number or street name): [facility] [phone] - [place], [place] - [address] [facility] [address] Agent: Please be advised that RX refills may take up to 3 business days. We ask that you follow-up with your pharmacy. Patient needs refill on her amLODipine (NORVASC) 10 MG tablet and hydrochlorothiazide (HYDRODIURIL) 25 MG tablet. Pt has a f/u appt on 05/01/17.

## 2017-04-24 NOTE — Telephone Encounter (Signed)
Called pt. to discuss her request for Amlodipine and Hydrodiuril refills.  Advised to contact the pharmacy, as Dr. Lawerance BachBurns reordered Hydrodiuril 25 mg, # 90 with RF x 3 on 03/27/17.  Also, informed pt. that it looks like Dr. Lucianne MussKumar has been managing her Amlodipine, and from the documentation on 12/21, needs to make an appt. with Dr. Lucianne MussKumar for refills.  Pt. stated she will check with the pharmacy, and has left a message with Dr. Remus BlakeKumar's office re: refill.

## 2017-04-24 NOTE — Telephone Encounter (Signed)
[facility].

## 2017-04-30 NOTE — Progress Notes (Signed)
Subjective:    Patient ID: Terry Hull, female    DOB: 1955/10/13, 62 y.o.   MRN: [id number]  HPI The patient is here for follow up.  Hypertension: She is taking her medication daily. She is compliant with a low sodium diet.  She denies chest pain, palpitations, edema, shortness of breath and regular headaches. She is exercising some, but knows she needs to be more.  She does monitor her blood pressure at home and it has been about what it is here.  It has improved significantly.  BP Readings from Last 3 Encounters:  05/01/17 (!) 144/70  02/27/17 (!) 164/78  01/30/17 (!) 150/82     Anxiety, Depression, grieving: She is taking her medication daily as prescribed. She denies any side effects from the medication. She feels her depression and anxiety are much better controlled.  She is still grieving for her son, but feels she is doing well, as well as she can be doing.  She continues to see improvement in how she is dealing with it.  GERD:  She is having bad reflux about once a week. The past couple of weeks it has been worse. It can be severe and she can even vomit.  It has been going on for about one year.  She takes pepcid complete and she has been taking it regularly now.    She has been out of work the month of December.  She will be returning to work May 13, 2017.   Most likely she will be retiring from work.  Medications and allergies reviewed with patient and updated if appropriate.  Patient Active Problem List   Diagnosis Date Noted  . Grieving 01/30/2017  . Nontraumatic partial bilateral rotator cuff tear 06/28/2016  . Cervical radiculopathy 06/28/2016  . Cervical paraspinal muscle spasm 06/18/2016  . Sleep difficulties 03/09/2016  . Hyperlipidemia 03/09/2016  . Type 2 diabetes mellitus with hyperglycemia, without long-term current use of insulin (HCC) 03/07/2015  . Anxiety 05/29/2010  . Uncontrolled hypertension 02/28/2010  . Allergic rhinitis 02/28/2010  .  Arthritis 02/28/2010  . SLEEP APNEA 02/08/2010  . ATHEROSLERO NATV ART EXTREM W/INTERMIT CLAUDICAT 02/07/2010    Current Outpatient Medications on File Prior to Visit  Medication Sig Dispense Refill  . albuterol (PROVENTIL HFA;VENTOLIN HFA) 108 (90 BASE) MCG/ACT inhaler Inhale 2 puffs into the lungs every 6 (six) hours as needed for wheezing or shortness of breath. 1 Inhaler 2  . amLODipine (NORVASC) 10 MG tablet TAKE 1 TABLET(10 MG) BY MOUTH DAILY 15 tablet 0  . carvedilol (COREG) 6.25 MG tablet TAKE 1 TABLET BY MOUTH TWICE DAILY WITH MEALS 180 tablet 1  . citalopram (CELEXA) 20 MG tablet TAKE 1 TABLET BY MOUTH DAILY 90 tablet 1  . Dulaglutide (TRULICITY) 1.5 MG/0.5ML SOPN INJECT UNDER THE SKIN WEEKLY AS DIRECTED 2 mL 0  . fluticasone (FLONASE) 50 MCG/ACT nasal spray Place 1 spray into both nostrils daily. 16 g 2  . hydrochlorothiazide (HYDRODIURIL) 25 MG tablet Take 1 tablet (25 mg total) by mouth daily. 90 tablet 3  . Insulin Pen Needle 31G X 5 MM MISC Use to inject insulin twice per day as directed with rx for bByetta. DX E11.09 200 each 3  . lisinopril (PRINIVIL,ZESTRIL) 40 MG tablet TAKE 1 TABLET(40 MG) BY MOUTH DAILY 90 tablet 3  . metFORMIN (GLUCOPHAGE-XR) 500 MG 24 hr tablet TAKE 4 TABLETS(2000 MG) BY MOUTH DAILY WITH SUPPER 120 tablet 0  . ONE TOUCH LANCETS MISC Use to help  check blood sugar twice a day 60 each 5  . ONE TOUCH ULTRA TEST test strip CHECK BLOOD SUGAR BEFORE BREAKFAST AND AT BEDTIME 100 each 0  . pravastatin (PRAVACHOL) 20 MG tablet TAKE 1 TABLET BY MOUTH DAILY 90 tablet 3   No current facility-administered medications on file prior to visit.     Past Medical History:  Diagnosis Date  . Abnormal electrocardiogram    consistent with LVH hypertrophy  . ALLERGIC RHINITIS   . ARTHRITIS   . DIABETES MELLITUS, TYPE II    follows with endo  . HYPERTENSION   . HYPERTRIGLYCERIDEMIA   . SLEEP APNEA     Past Surgical History:  Procedure Laterality Date  . ABDOMINAL  HYSTERECTOMY    . HAND TENDON SURGERY    . NASAL POLYP SURGERY      Social History   Socioeconomic History  . Marital status: Married    Spouse name: None  . Number of children: 1  . Years of education: None  . Highest education level: None  Social Needs  . Financial resource strain: None  . Food insecurity - worry: None  . Food insecurity - inability: None  . Transportation needs - medical: None  . Transportation needs - non-medical: None  Occupational History  . 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 Smoking status: Former Smoker    Packs/day: 1.00    Last attempt to quit: 12/29/2012    Years since quitting: 4.3  . Smokeless tobacco: Never Used  Substance and Sexual Activity  . Alcohol use: Yes    Comment: 4 per week  . Drug use: No  . Sexual activity: None  Other Topics Concern  . None  Social History Narrative   She has 1 child and 3 grandchildren   She works at [employer]-asst manager       Family History  Problem Relation Age of Onset  . Cancer Mother        Ovarian Cancer  . Hyperlipidemia Mother   . Hypertension Mother   . Diabetes Other        1st degree relative    Review of Systems  Constitutional: Negative for chills and fever.  HENT: Positive for congestion. Negative for ear pain (ear pressure).   Respiratory: Positive for cough and wheezing. Negative for shortness of breath.   Cardiovascular: Negative for chest pain, palpitations and leg swelling.  Neurological: Negative for light-headedness and headaches.  Psychiatric/Behavioral: Positive for dysphoric mood. Negative for sleep disturbance. The patient is nervous/anxious.        Objective:   Vitals:   05/01/17 0935  BP: (!) 144/70  Pulse: 73  Resp: 16  Temp: 98.5 F (36.9 C)  SpO2: 96%   Wt Readings from Last 3 Encounters:  05/01/17 175 lb (79.4 kg)  02/27/17 176 lb (79.8 kg)  01/30/17 178 lb (80.7 kg)   Body mass index  is 28.68 kg/m.   Physical Exam    Constitutional: Appears well-developed and well-nourished. No distress.  HENT:  Head: Normocephalic and atraumatic.  Neck: Neck supple. No tracheal deviation present. No thyromegaly present.  No cervical lymphadenopathy Cardiovascular: Normal rate, regular rhythm and normal heart sounds.   No murmur heard. No carotid bruit .  No edema Pulmonary/Chest: Effort normal and breath sounds normal. No respiratory distress. No has no wheezes. No rales.  Skin: Skin is warm and dry. Not diaphoretic.  Psychiatric: Normal mood and affect.  Behavior is normal.      Assessment & Plan:    See Problem List for Assessment and Plan of chronic medical problems.

## 2017-05-01 ENCOUNTER — Ambulatory Visit: Payer: Managed Care, Other (non HMO) | Admitting: Internal Medicine

## 2017-05-01 ENCOUNTER — Encounter: Payer: Self-pay | Admitting: Internal Medicine

## 2017-05-01 VITALS — BP 144/70 | HR 73 | Temp 98.5°F | Resp 16 | Wt 175.0 lb

## 2017-05-01 DIAGNOSIS — F419 Anxiety disorder, unspecified: Secondary | ICD-10-CM

## 2017-05-01 DIAGNOSIS — K219 Gastro-esophageal reflux disease without esophagitis: Secondary | ICD-10-CM

## 2017-05-01 DIAGNOSIS — F4321 Adjustment disorder with depressed mood: Secondary | ICD-10-CM

## 2017-05-01 DIAGNOSIS — I10 Essential (primary) hypertension: Secondary | ICD-10-CM

## 2017-05-01 MED ORDER — OMEPRAZOLE 20 MG PO CPDR: 20.0000 mg | DELAYED_RELEASE_CAPSULE | Freq: Every day | ORAL | 5 refills | Status: DC

## 2017-05-01 NOTE — Assessment & Plan Note (Signed)
Blood pressure much improved On average at home blood pressure seems to be fairly controlled We will continue to monitor and titrate medication if needed Continue low-sodium diet Work on increasing exercise and weight loss Continue to monitor blood pressure at home

## 2017-05-01 NOTE — Patient Instructions (Addendum)
Medications reviewed and updated.  Changes include starting omeprazole for your heartburn.  Your prescription(s) have been submitted to your pharmacy. Please take as directed and contact our office if you believe you are having problem(s) with the medication(s).    Please followup in 6 months    Gastroesophageal Reflux Disease, Adult Normally, food travels down the esophagus and stays in the stomach to be digested. However, when a person has gastroesophageal reflux disease (GERD), food and stomach acid move back up into the esophagus. When this happens, the esophagus becomes sore and inflamed. Over time, GERD can create small holes (ulcers) in the lining of the esophagus. What are the causes? This condition is caused by a problem with the muscle between the esophagus and the stomach (lower esophageal sphincter, or LES). Normally, the LES muscle closes after food passes through the esophagus to the stomach. When the LES is weakened or abnormal, it does not close properly, and that allows food and stomach acid to go back up into the esophagus. The LES can be weakened by certain dietary substances, medicines, and medical conditions, including:  Tobacco use.  Pregnancy.  Having a hiatal hernia.  Heavy alcohol use.  Certain foods and beverages, such as coffee, chocolate, onions, and peppermint.  What increases the risk? This condition is more likely to develop in:  People who have an increased body weight.  People who have connective tissue disorders.  People who use NSAID medicines.  What are the signs or symptoms? Symptoms of this condition include:  Heartburn.  Difficult or painful swallowing.  The feeling of having a lump in the throat.  Abitter taste in the mouth.  Bad breath.  Having a large amount of saliva.  Having an upset or bloated stomach.  Belching.  Chest pain.  Shortness of breath or wheezing.  Ongoing (chronic) cough or a night-time  cough.  Wearing away of tooth enamel.  Weight loss.  Different conditions can cause chest pain. Make sure to see your health care provider if you experience chest pain. How is this diagnosed? Your health care provider will take a medical history and perform a physical exam. To determine if you have mild or severe GERD, your health care provider may also monitor how you respond to treatment. You may also have other tests, including:  An endoscopy toexamine your stomach and esophagus with a small camera.  A test thatmeasures the acidity level in your esophagus.  A test thatmeasures how much pressure is on your esophagus.  A barium swallow or modified barium swallow to show the shape, size, and functioning of your esophagus.  How is this treated? The goal of treatment is to help relieve your symptoms and to prevent complications. Treatment for this condition may vary depending on how severe your symptoms are. Your health care provider may recommend:  Changes to your diet.  Medicine.  Surgery.  Follow these instructions at home: Diet  Follow a diet as recommended by your health care provider. This may involve avoiding foods and drinks such as: ? Coffee and tea (with or without caffeine). ? Drinks that containalcohol. ? Energy drinks and sports drinks. ? Carbonated drinks or sodas. ? Chocolate and cocoa. ? Peppermint and mint flavorings. ? Garlic and onions. ? Horseradish. ? Spicy and acidic foods, including peppers, chili powder, curry powder, vinegar, hot sauces, and barbecue sauce. ? Citrus fruit juices and citrus fruits, such as oranges, lemons, and limes. ? Tomato-based foods, such as red sauce, chili, salsa, and  pizza with red sauce. ? Fried and fatty foods, such as donuts, french fries, potato chips, and high-fat dressings. ? High-fat meats, such as hot dogs and fatty cuts of red and white meats, such as rib eye steak, sausage, ham, and bacon. ? High-fat dairy items,  such as whole milk, butter, and cream cheese.  Eat small, frequent meals instead of large meals.  Avoid drinking large amounts of liquid with your meals.  Avoid eating meals during the 2-3 hours before bedtime.  Avoid lying down right after you eat.  Do not exercise right after you eat. General instructions  Pay attention to any changes in your symptoms.  Take over-the-counter and prescription medicines only as told by your health care provider. Do not take aspirin, ibuprofen, or other NSAIDs unless your health care provider told you to do so.  Do not use any tobacco products, including cigarettes, chewing tobacco, and e-cigarettes. If you need help quitting, ask your health care provider.  Wear loose-fitting clothing. Do not wear anything tight around your waist that causes pressure on your abdomen.  Raise (elevate) the head of your bed 6 inches (15cm).  Try to reduce your stress, such as with yoga or meditation. If you need help reducing stress, ask your health care provider.  If you are overweight, reduce your weight to an amount that is healthy for you. Ask your health care provider for guidance about a safe weight loss goal.  Keep all follow-up visits as told by your health care provider. This is important. Contact a health care provider if:  You have new symptoms.  You have unexplained weight loss.  You have difficulty swallowing, or it hurts to swallow.  You have wheezing or a persistent cough.  Your symptoms do not improve with treatment.  You have a hoarse voice. Get help right away if:  You have pain in your arms, neck, jaw, teeth, or back.  You feel sweaty, dizzy, or light-headed.  You have chest pain or shortness of breath.  You vomit and your vomit looks like blood or coffee grounds.  You faint.  Your stool is bloody or black.  You cannot swallow, drink, or eat. This information is not intended to replace advice given to you by your health care  provider. Make sure you discuss any questions you have with your health care provider. Document Released: 01/24/2005 Document Revised: 09/14/2015 Document Reviewed: 08/11/2014 Elsevier Interactive Patient Education  [employer]2018 [employer]

## 2017-05-01 NOTE — Assessment & Plan Note (Signed)
She has fairly chronic GERD and has been taking Pepcid complete as needed and recently more regularly.  GERD is not controlled with this We will give her information on GERD diet Start omeprazole 20 mg daily-she will take this until her heartburn is controlled and then will try switching back to the Pepcid Complete only

## 2017-05-01 NOTE — Assessment & Plan Note (Addendum)
Doing well on current dose of citalopram-we will continue Will be going back to work 05/13/17

## 2017-05-01 NOTE — Assessment & Plan Note (Addendum)
Well controlled Continue celexa at current dose Will be returning to work on 05/13/17

## 2017-05-02 ENCOUNTER — Other Ambulatory Visit: Payer: Self-pay | Admitting: Internal Medicine

## 2017-05-10 ENCOUNTER — Other Ambulatory Visit: Payer: Self-pay | Admitting: Endocrinology

## 2017-05-24 ENCOUNTER — Other Ambulatory Visit: Payer: Self-pay | Admitting: Endocrinology

## 2017-05-26 ENCOUNTER — Other Ambulatory Visit: Payer: Self-pay | Admitting: Endocrinology

## 2017-05-26 NOTE — Telephone Encounter (Signed)
Hypertension is being managed by Dr. Lawerance BachBurns

## 2017-05-27 ENCOUNTER — Telehealth: Payer: Self-pay | Admitting: Endocrinology

## 2017-05-27 NOTE — Telephone Encounter (Signed)
Rx refilled earlier today.

## 2017-05-27 NOTE — Telephone Encounter (Signed)
Patient is out of Amlodipine Besylate 10mg -completely out since last Friday (05/24/17). [facility] [address] asap

## 2017-06-04 ENCOUNTER — Other Ambulatory Visit: Payer: Self-pay | Admitting: Internal Medicine

## 2017-06-10 ENCOUNTER — Other Ambulatory Visit (INDEPENDENT_AMBULATORY_CARE_PROVIDER_SITE_OTHER): Payer: BLUE CROSS/BLUE SHIELD

## 2017-06-10 DIAGNOSIS — E1165 Type 2 diabetes mellitus with hyperglycemia: Secondary | ICD-10-CM

## 2017-06-10 DIAGNOSIS — Z794 Long term (current) use of insulin: Secondary | ICD-10-CM

## 2017-06-10 LAB — COMPREHENSIVE METABOLIC PANEL
ALBUMIN: 4.1 g/dL (ref 3.5–5.2)
ALT: 29 U/L (ref 0–35)
AST: 20 U/L (ref 0–37)
Alkaline Phosphatase: 49 U/L (ref 39–117)
BUN: 16 mg/dL (ref 6–23)
CHLORIDE: 97 meq/L (ref 96–112)
CO2: 28 meq/L (ref 19–32)
CREATININE: 0.8 mg/dL (ref 0.40–1.20)
Calcium: 9.6 mg/dL (ref 8.4–10.5)
GFR: 93.36 mL/min (ref >60.00)
Glucose, Bld: 128 mg/dL — ABNORMAL HIGH (ref 70–99)
POTASSIUM: 3.9 meq/L (ref 3.5–5.1)
SODIUM: 137 meq/L (ref 135–145)
Total Bilirubin: 0.4 mg/dL (ref 0.2–1.2)
Total Protein: 7 g/dL (ref 6.0–8.3)

## 2017-06-10 LAB — LIPID PANEL
CHOL/HDL RATIO: 3
Cholesterol: 164 mg/dL (ref 0–200)
HDL: 47.7 mg/dL (ref >39.00)
NONHDL: 116.02
Triglycerides: 301 mg/dL — ABNORMAL HIGH (ref 0.0–149.0)
VLDL: 60.2 mg/dL — AB (ref 0.0–40.0)

## 2017-06-10 LAB — LDL CHOLESTEROL, DIRECT: Direct LDL: 74 mg/dL

## 2017-06-10 LAB — HEMOGLOBIN A1C: Hgb A1c MFr Bld: 6.5 % (ref 4.6–6.5)

## 2017-06-11 ENCOUNTER — Telehealth: Payer: Self-pay

## 2017-06-11 NOTE — Telephone Encounter (Signed)
Called pt because Trulicity is not covered by her insurance. Needs to call insurance and find a covered alternative, had to LVM

## 2017-06-13 ENCOUNTER — Ambulatory Visit: Payer: BLUE CROSS/BLUE SHIELD | Admitting: Endocrinology

## 2017-06-13 ENCOUNTER — Encounter: Payer: Self-pay | Admitting: Endocrinology

## 2017-06-13 VITALS — BP 104/72 | HR 78 | Ht 65.75 in | Wt 173.2 lb

## 2017-06-13 DIAGNOSIS — E782 Mixed hyperlipidemia: Secondary | ICD-10-CM | POA: Diagnosis not present

## 2017-06-13 DIAGNOSIS — E1165 Type 2 diabetes mellitus with hyperglycemia: Secondary | ICD-10-CM | POA: Diagnosis not present

## 2017-06-13 MED ORDER — FENOFIBRATE 145 MG PO TABS: 145.0000 mg | ORAL_TABLET | Freq: Every day | ORAL | 3 refills | Status: DC

## 2017-06-13 NOTE — Patient Instructions (Signed)
due for EYE exam again

## 2017-06-13 NOTE — Progress Notes (Signed)
Patient ID: Terry Hull, female   DOB: [birth year], 62 y.o.   MRN: [id number]           Reason for Appointment: Follow-up for Type 2 Diabetes   History of Present Illness:          Date of diagnosis of type 2 diabetes mellitus:   2013       Background history:   Patient had been complaining of fatigue at the time of diagnosis and lab glucose indicated her level was 453 She was initially treated with metformin but she said that since this made her sleepy she did not continue this Subsequently he has been on the variety of medications including Januvia which she is still taking She thinks she had fairly good control with Actos 45 mg but was stopped because of weight gain in 11/2013 At that time she was started on Victoza Her blood sugars were significantly high prior to her initial consultation. On her initial visit she was started on metformin  Recent history:   She has been on Trulicity 1.5 mg since 2/17  She has not been seen in follow-up since 10/2016 Her A1c has gone down to 6.5, previously range 6.2-6.8   Current blood sugar patterns and problems identified:   She has not checked her blood sugars more than 3 times in the last month  Although she has not changed her diet she has started doing some walking recently which is helping her blood sugar control  Also she thinks she has less stress now  No side effects with Trulicity except for occasional morning nausea  She has only 2 readings recently ranging from 115-149 with higher reading around lunchtime  Lab glucose was 128  Overall she is taking since she is watching her carbohydrates and mostly trying to eat vegetables and fruits and less meat   Hypoglycemic drugs the patient is taking are: Metformin ER 2000 mg a day      Side effects from medications have been:Weight gain form Actos   Compliance with the medical regimen: Fair  Glucose monitoring:  done usually 1-2 times a day         Glucometer: One Touch.        Blood Glucose readings as above   Self-care:  Typical meal intake: Breakfast is egg White omelette with vegetables, lunch is a salad, dinner is a sandwich and has snacks with food.   Avoiding drinks with sugar                Dietician visit, most recent: 01/21/15               Exercise: Walking 3-4 times a week  Weight history: Previous range 145-201  Wt Readings from Last 3 Encounters:  06/13/17 173 lb 3.2 oz (78.6 kg)  05/01/17 175 lb (79.4 kg)  02/27/17 176 lb (79.8 kg)    Glycemic control:   Lab Results  Component Value Date   HGBA1C 6.5 06/10/2017   HGBA1C 6.8 (H) 10/30/2016   HGBA1C 6.2 06/28/2016   Lab Results  Component Value Date   MICROALBUR 5.3 (H) 10/30/2016   LDLCALC 82 03/07/2015   CREATININE 0.80 06/10/2017    Lab on 06/10/2017  Component Date Value Ref Range Status  . Cholesterol 06/10/2017 164  0 - 200 mg/dL Final   ATP III Classification       Desirable:  < 200 mg/dL  Borderline High:  200 - 239 mg/dL          High:  > = 161 mg/dL  . Triglycerides 06/10/2017 301.0* 0.0 - 149.0 mg/dL Final   Normal:  <096 mg/dLBorderline High:  150 - 199 mg/dL  . HDL 06/10/2017 47.70  >39.00 mg/dL Final  . VLDL 04/54/0981 60.2* 0.0 - 40.0 mg/dL Final  . Total CHOL/HDL Ratio 06/10/2017 3   Final                  Men          Women1/2 Average Risk     3.4          3.3Average Risk          5.0          4.42X Average Risk          9.6          7.13X Average Risk          15.0          11.0                      . NonHDL 06/10/2017 116.02   Final   NOTE:  Non-HDL goal should be 30 mg/dL higher than patient's LDL goal (i.e. LDL goal of < 70 mg/dL, would have non-HDL goal of < 100 mg/dL)  . Sodium 06/10/2017 137  135 - 145 mEq/L Final  . Potassium 06/10/2017 3.9  3.5 - 5.1 mEq/L Final  . Chloride 06/10/2017 97  96 - 112 mEq/L Final  . CO2 06/10/2017 28  19 - 32 mEq/L Final  . Glucose, Bld 06/10/2017 128* 70 - 99 mg/dL Final  . BUN 19/14/7829 16  6 - 23 mg/dL  Final  . Creatinine, Ser 06/10/2017 0.80  0.40 - 1.20 mg/dL Final  . Total Bilirubin 06/10/2017 0.4  0.2 - 1.2 mg/dL Final  . Alkaline Phosphatase 06/10/2017 49  39 - 117 U/L Final  . AST 06/10/2017 20  0 - 37 U/L Final  . ALT 06/10/2017 29  0 - 35 U/L Final  . Total Protein 06/10/2017 7.0  6.0 - 8.3 g/dL Final  . Albumin 56/21/3086 4.1  3.5 - 5.2 g/dL Final  . Calcium 57/84/6962 9.6  8.4 - 10.5 mg/dL Final  . GFR 95/28/4132 93.36  >60.00 mL/min Final  . Hgb A1c MFr Bld 06/10/2017 6.5  4.6 - 6.5 % Final   Glycemic Control Guidelines for People with Diabetes:Non Diabetic:  <6%Goal of Therapy: <7%Additional Action Suggested:  >8%   . Direct LDL 06/10/2017 74.0  mg/dL Final   Optimal:  <440 mg/dLNear or Above Optimal:  100-129 mg/dLBorderline High:  130-159 mg/dLHigh:  160-189 mg/dLVery High:  >190 mg/dL      Allergies as of 05/02/7251      Reactions   Oxycodone    Trazodone And Nefazodone Cough      Medication List        Accurate as of 06/13/17  3:43 PM. Always use your most recent med list.          albuterol 108 (90 Base) MCG/ACT inhaler Commonly known as:  PROVENTIL HFA;VENTOLIN HFA Inhale 2 puffs into the lungs every 6 (six) hours as needed for wheezing or shortness of breath.   amLODipine 10 MG tablet Commonly known as:  NORVASC TAKE 1 TABLET(10 MG) BY MOUTH DAILY   carvedilol 6.25 MG tablet Commonly known as:  COREG TAKE 1 TABLET  BY MOUTH TWICE DAILY WITH MEALS   citalopram 20 MG tablet Commonly known as:  CELEXA TAKE 1 TABLET BY MOUTH DAILY   Dulaglutide 1.5 MG/0.5ML Sopn Commonly known as:  TRULICITY INJECT UNDER THE SKIN WEEKLY AS DIRECTED   fenofibrate 145 MG tablet Commonly known as:  TRICOR Take 1 tablet (145 mg total) by mouth daily.   fluticasone 50 MCG/ACT nasal spray Commonly known as:  FLONASE Place 1 spray into both nostrils daily.   hydrochlorothiazide 25 MG tablet Commonly known as:  HYDRODIURIL Take 1 tablet (25 mg total) by mouth  daily.   Insulin Pen Needle 31G X 5 MM Misc Use to inject insulin twice per day as directed with rx for bByetta. DX E11.09   lisinopril 40 MG tablet Commonly known as:  PRINIVIL,ZESTRIL TAKE 1 TABLET(40 MG) BY MOUTH DAILY   metFORMIN 500 MG 24 hr tablet Commonly known as:  GLUCOPHAGE-XR TAKE 4 TABLETS BY MOUTH DAILY WITH SUPPER   omeprazole 20 MG capsule Commonly known as:  PRILOSEC Take 1 capsule (20 mg total) by mouth daily before breakfast.   ONE TOUCH LANCETS Misc Use to help check blood sugar twice a day   ONE TOUCH ULTRA TEST test strip Generic drug:  glucose blood CHECK BLOOD SUGAR BEFORE BREAKFAST AND AT BEDTIME   pravastatin 20 MG tablet Commonly known as:  PRAVACHOL TAKE 1 TABLET BY MOUTH DAILY       Allergies:  Allergies  Allergen Reactions  . Oxycodone   . Trazodone And Nefazodone Cough    Past Medical History:  Diagnosis Date  . Abnormal electrocardiogram    consistent with LVH hypertrophy  . ALLERGIC RHINITIS   . ARTHRITIS   . DIABETES MELLITUS, TYPE II    follows with endo  . HYPERTENSION   . HYPERTRIGLYCERIDEMIA   . SLEEP APNEA     Past Surgical History:  Procedure Laterality Date  . ABDOMINAL HYSTERECTOMY    . HAND TENDON SURGERY    . NASAL POLYP SURGERY      Family History  Problem Relation Age of Onset  . Cancer Mother        Ovarian Cancer  . Hyperlipidemia Mother   . Hypertension Mother   . Diabetes Other        1st degree relative    Social History:  reports that she quit smoking about 4 years ago. She smoked 1.00 pack per day. she has never used smokeless tobacco. She reports that she drinks alcohol. She reports that she does not use drugs.    Review of Systems    Lipid history: On pravastatin for several years, has had  high triglycerides also which are persistent    Lab Results  Component Value Date   CHOL 164 06/10/2017   HDL 47.70 06/10/2017   LDLCALC 82 03/07/2015   LDLDIRECT 74.0 06/10/2017   TRIG 301.0 (H)  06/10/2017   CHOLHDL 3 06/10/2017            Eyes:  She is still not up to date with this  Hypertension: Treated with medications for 6-7 years, On 40 mg lisinopril, amlodipine and HCTZ; followed by PCP annually   BP Readings from Last 3 Encounters:  06/13/17 104/72  05/01/17 (!) 144/70  02/27/17 (!) 164/78      LABS:  Lab on 06/10/2017  Component Date Value Ref Range Status  . Cholesterol 06/10/2017 164  0 - 200 mg/dL Final   ATP III Classification  Desirable:  < 200 mg/dL               Borderline High:  200 - 239 mg/dL          High:  > = 811240 mg/dL  . Triglycerides 06/10/2017 301.0* 0.0 - 149.0 mg/dL Final   Normal:  <914<150 mg/dLBorderline High:  150 - 199 mg/dL  . HDL 06/10/2017 47.70  >39.00 mg/dL Final  . VLDL 78/29/562102/02/2018 60.2* 0.0 - 40.0 mg/dL Final  . Total CHOL/HDL Ratio 06/10/2017 3   Final                  Men          Women1/2 Average Risk     3.4          3.3Average Risk          5.0          4.42X Average Risk          9.6          7.13X Average Risk          15.0          11.0                      . NonHDL 06/10/2017 116.02   Final   NOTE:  Non-HDL goal should be 30 mg/dL higher than patient's LDL goal (i.e. LDL goal of < 70 mg/dL, would have non-HDL goal of < 100 mg/dL)  . Sodium 06/10/2017 137  135 - 145 mEq/L Final  . Potassium 06/10/2017 3.9  3.5 - 5.1 mEq/L Final  . Chloride 06/10/2017 97  96 - 112 mEq/L Final  . CO2 06/10/2017 28  19 - 32 mEq/L Final  . Glucose, Bld 06/10/2017 128* 70 - 99 mg/dL Final  . BUN 30/86/578402/02/2018 16  6 - 23 mg/dL Final  . Creatinine, Ser 06/10/2017 0.80  0.40 - 1.20 mg/dL Final  . Total Bilirubin 06/10/2017 0.4  0.2 - 1.2 mg/dL Final  . Alkaline Phosphatase 06/10/2017 49  39 - 117 U/L Final  . AST 06/10/2017 20  0 - 37 U/L Final  . ALT 06/10/2017 29  0 - 35 U/L Final  . Total Protein 06/10/2017 7.0  6.0 - 8.3 g/dL Final  . Albumin 69/62/952802/02/2018 4.1  3.5 - 5.2 g/dL Final  . Calcium 41/32/440102/02/2018 9.6  8.4 - 10.5 mg/dL Final  . GFR  02/72/536602/02/2018 93.36  >60.00 mL/min Final  . Hgb A1c MFr Bld 06/10/2017 6.5  4.6 - 6.5 % Final   Glycemic Control Guidelines for People with Diabetes:Non Diabetic:  <6%Goal of Therapy: <7%Additional Action Suggested:  >8%   . Direct LDL 06/10/2017 74.0  mg/dL Final   Optimal:  <440<100 mg/dLNear or Above Optimal:  100-129 mg/dLBorderline High:  130-159 mg/dLHigh:  160-189 mg/dLVery High:  >190 mg/dL    Physical Examination:  BP 104/72 (BP Location: Left Arm, Patient Position: Sitting, Cuff Size: Normal)   Pulse 78   Ht 5' 5.75" (1.67 m)   Wt 173 lb 3.2 oz (78.6 kg)   SpO2 98%   BMI 28.17 kg/m         ASSESSMENT:  Diabetes type 2,  with obesity  See history of present illness for detailed discussion of  current management, blood sugar patterns and problems identified  A1c is now 6.5  She is on a regimen of metformin and Trulicity and has been regular with her medications  She  has benefited from starting to walk and also has lost couple of pounds Again she does not like to check her blood sugars much at home are mildly increased in the mornings, this is not new   HYPERTENSION: Blood pressure is controlled  LIPIDS: She needs to be controlling her triglycerides, since her diet is generally lowfat she needs to add fenofibrate 145 mg daily  PLAN:   Continue Trulicity 1.5 mg weekly with metformin.    She should watch her blood sugars more consistently at home and also including some readings after meals  Follow-up in 4 months Needs regular eye exams and she will schedule   Patient Instructions  due for EYE exam again   Reather Littler 06/13/2017, 3:43 PM   Note: This office note was prepared with Dragon voice recognition system technology. Any transcriptional errors that result from this process are unintentional.

## 2017-07-05 ENCOUNTER — Other Ambulatory Visit: Payer: Self-pay | Admitting: Endocrinology

## 2017-08-02 ENCOUNTER — Other Ambulatory Visit: Payer: Self-pay | Admitting: Endocrinology

## 2017-08-12 ENCOUNTER — Ambulatory Visit (INDEPENDENT_AMBULATORY_CARE_PROVIDER_SITE_OTHER): Payer: BLUE CROSS/BLUE SHIELD

## 2017-08-12 DIAGNOSIS — Z111 Encounter for screening for respiratory tuberculosis: Secondary | ICD-10-CM | POA: Diagnosis not present

## 2017-08-14 LAB — TB SKIN TEST
Induration: 0 mm
TB Skin Test: NEGATIVE

## 2017-09-01 ENCOUNTER — Other Ambulatory Visit: Payer: Self-pay | Admitting: Endocrinology

## 2017-09-01 ENCOUNTER — Other Ambulatory Visit: Payer: Self-pay | Admitting: Internal Medicine

## 2017-09-07 ENCOUNTER — Other Ambulatory Visit: Payer: Self-pay | Admitting: Internal Medicine

## 2017-09-11 ENCOUNTER — Other Ambulatory Visit: Payer: BLUE CROSS/BLUE SHIELD

## 2017-09-16 ENCOUNTER — Ambulatory Visit: Payer: BLUE CROSS/BLUE SHIELD | Admitting: Endocrinology

## 2017-10-08 ENCOUNTER — Other Ambulatory Visit: Payer: Self-pay | Admitting: Endocrinology

## 2017-10-13 ENCOUNTER — Other Ambulatory Visit: Payer: Self-pay | Admitting: Endocrinology

## 2017-10-18 ENCOUNTER — Other Ambulatory Visit: Payer: Self-pay | Admitting: Endocrinology

## 2017-10-27 ENCOUNTER — Other Ambulatory Visit: Payer: Self-pay | Admitting: Internal Medicine

## 2017-10-29 ENCOUNTER — Ambulatory Visit: Payer: Managed Care, Other (non HMO) | Admitting: Internal Medicine

## 2017-11-08 ENCOUNTER — Other Ambulatory Visit (INDEPENDENT_AMBULATORY_CARE_PROVIDER_SITE_OTHER): Payer: BLUE CROSS/BLUE SHIELD

## 2017-11-08 DIAGNOSIS — E782 Mixed hyperlipidemia: Secondary | ICD-10-CM

## 2017-11-08 DIAGNOSIS — E1165 Type 2 diabetes mellitus with hyperglycemia: Secondary | ICD-10-CM | POA: Diagnosis not present

## 2017-11-08 LAB — COMPREHENSIVE METABOLIC PANEL
ALBUMIN: 4.3 g/dL (ref 3.5–5.2)
ALK PHOS: 33 U/L — AB (ref 39–117)
ALT: 24 U/L (ref 0–35)
AST: 17 U/L (ref 0–37)
BILIRUBIN TOTAL: 0.3 mg/dL (ref 0.2–1.2)
BUN: 11 mg/dL (ref 6–23)
CALCIUM: 9.8 mg/dL (ref 8.4–10.5)
CO2: 29 meq/L (ref 19–32)
CREATININE: 0.81 mg/dL (ref 0.40–1.20)
Chloride: 101 mEq/L (ref 96–112)
GFR: 91.9 mL/min (ref >60.00)
Glucose, Bld: 211 mg/dL — ABNORMAL HIGH (ref 70–99)
Potassium: 3.5 mEq/L (ref 3.5–5.1)
Sodium: 139 mEq/L (ref 135–145)
Total Protein: 7.1 g/dL (ref 6.0–8.3)

## 2017-11-08 LAB — MICROALBUMIN / CREATININE URINE RATIO
CREATININE, U: 81.2 mg/dL
Microalb Creat Ratio: 0.9 mg/g (ref 0.0–30.0)
Microalb, Ur: 0.8 mg/dL (ref 0.0–1.9)

## 2017-11-08 LAB — LIPID PANEL
CHOLESTEROL: 162 mg/dL (ref 0–200)
HDL: 64 mg/dL (ref >39.00)
LDL Cholesterol: 75 mg/dL (ref 0–99)
NonHDL: 97.84
TRIGLYCERIDES: 115 mg/dL (ref 0.0–149.0)
Total CHOL/HDL Ratio: 3
VLDL: 23 mg/dL (ref 0.0–40.0)

## 2017-11-08 LAB — HEMOGLOBIN A1C: HEMOGLOBIN A1C: 6.6 % — AB (ref 4.6–6.5)

## 2017-11-11 ENCOUNTER — Encounter: Payer: Self-pay | Admitting: Endocrinology

## 2017-11-11 ENCOUNTER — Ambulatory Visit: Payer: BLUE CROSS/BLUE SHIELD | Admitting: Endocrinology

## 2017-11-11 VITALS — BP 140/72 | HR 81 | Ht 67.75 in | Wt 178.4 lb

## 2017-11-11 DIAGNOSIS — E119 Type 2 diabetes mellitus without complications: Secondary | ICD-10-CM | POA: Diagnosis not present

## 2017-11-11 NOTE — Patient Instructions (Addendum)
Exercise daily  Low carb more protein  Check blood sugars on waking up  2/7  Also check blood sugars about 2 hours after a meal and do this after different meals by rotation  Recommended blood sugar levels on waking up is 90-130 and about 2 hours after meal is 130-160  Please bring your blood sugar monitor to each visit, thank you

## 2017-11-11 NOTE — Progress Notes (Signed)
Patient ID: Terry Hull, female   DOB: 1956/02/10, 62 y.o.   MRN: [id number]           Reason for Appointment: Follow-up for Type 2 Diabetes   History of Present Illness:          Date of diagnosis of type 2 diabetes mellitus:   2013       Background history:   Patient had been complaining of fatigue at the time of diagnosis and lab glucose indicated her level was 453 She was initially treated with metformin but she said that since this made her sleepy she did not continue this Subsequently he has been on the variety of medications including Januvia which she is still taking She thinks she had fairly good control with Actos 45 mg but was stopped because of weight gain in 11/2013 At that time she was started on Victoza Her blood sugars were significantly high prior to her initial consultation. On her initial visit she was started on metformin  Recent history:   She has been on Trulicity 1.5 mg since 2/17  Her A1c has been stable under 7% and now 6.6, previously range 6.2-6.8   Current blood sugar patterns and problems identified:   She has not brought her monitor for download today  Although she thinks she has checked some readings at various times her higher sugar is only about 160-170 at home and was 211 after lunch when she had a bagel  She is not trying to balance her meals lately with protein  Also not exercising was previously walking regularly  Has gained about 5 pounds  She does try to control portions usually with the help of Trulicity causes only occasional nausea after eating  She takes this regularly  Non-insulin hypoglycemic drugs the patient is taking are: Metformin ER 2000 mg a day, Trulicity 1.5 mg weekly      Side effects from medications have been:Weight gain form Actos   Compliance with the medical regimen: Fair  Glucose monitoring:  done usually 1-2 times a day         Glucometer: One Touch.      Blood Glucose readings range from  91-170   Self-care:  Typical meal intake: Breakfast is mostly bagels now, lunch is a salad, dinner is a sandwich and has snacks with food.   Avoiding drinks with sugar                Dietician visit, most recent: 01/21/15               Exercise: Walking 0-4 times a week  Weight history: Previous range 145-201  Wt Readings from Last 3 Encounters:  11/11/17 178 lb 6.4 oz (80.9 kg)  06/13/17 173 lb 3.2 oz (78.6 kg)  05/01/17 175 lb (79.4 kg)    Glycemic control:   Lab Results  Component Value Date   HGBA1C 6.6 (H) 11/08/2017   HGBA1C 6.5 06/10/2017   HGBA1C 6.8 (H) 10/30/2016   Lab Results  Component Value Date   MICROALBUR 0.8 11/08/2017   LDLCALC 75 11/08/2017   CREATININE 0.81 11/08/2017    Lab on 11/08/2017  Component Date Value Ref Range Status  . Microalb, Ur 11/08/2017 0.8  0.0 - 1.9 mg/dL Final  . Creatinine,U 40/98/1191 81.2  mg/dL Final  . Microalb Creat Ratio 11/08/2017 0.9  0.0 - 30.0 mg/g Final  . Cholesterol 11/08/2017 162  0 - 200 mg/dL Final   ATP III Classification  Desirable:  < 200 mg/dL               Borderline High:  200 - 239 mg/dL          High:  > = 161 mg/dL  . Triglycerides 11/08/2017 115.0  0.0 - 149.0 mg/dL Final   Normal:  <096 mg/dLBorderline High:  150 - 199 mg/dL  . HDL 11/08/2017 64.00  >39.00 mg/dL Final  . VLDL 04/54/0981 23.0  0.0 - 40.0 mg/dL Final  . LDL Cholesterol 11/08/2017 75  0 - 99 mg/dL Final  . Total CHOL/HDL Ratio 11/08/2017 3   Final                  Men          Women1/2 Average Risk     3.4          3.3Average Risk          5.0          4.42X Average Risk          9.6          7.13X Average Risk          15.0          11.0                      . NonHDL 11/08/2017 97.84   Final   NOTE:  Non-HDL goal should be 30 mg/dL higher than patient's LDL goal (i.e. LDL goal of < 70 mg/dL, would have non-HDL goal of < 100 mg/dL)  . Sodium 11/08/2017 139  135 - 145 mEq/L Final  . Potassium 11/08/2017 3.5  3.5 - 5.1 mEq/L Final   . Chloride 11/08/2017 101  96 - 112 mEq/L Final  . CO2 11/08/2017 29  19 - 32 mEq/L Final  . Glucose, Bld 11/08/2017 211* 70 - 99 mg/dL Final  . BUN 19/14/7829 11  6 - 23 mg/dL Final  . Creatinine, Ser 11/08/2017 0.81  0.40 - 1.20 mg/dL Final  . Total Bilirubin 11/08/2017 0.3  0.2 - 1.2 mg/dL Final  . Alkaline Phosphatase 11/08/2017 33* 39 - 117 U/L Final  . AST 11/08/2017 17  0 - 37 U/L Final  . ALT 11/08/2017 24  0 - 35 U/L Final  . Total Protein 11/08/2017 7.1  6.0 - 8.3 g/dL Final  . Albumin 56/21/3086 4.3  3.5 - 5.2 g/dL Final  . Calcium 57/84/6962 9.8  8.4 - 10.5 mg/dL Final  . GFR 95/28/4132 91.90  >60.00 mL/min Final  . Hgb A1c MFr Bld 11/08/2017 6.6* 4.6 - 6.5 % Final   Glycemic Control Guidelines for People with Diabetes:Non Diabetic:  <6%Goal of Therapy: <7%Additional Action Suggested:  >8%       Allergies as of 11/11/2017      Reactions   Oxycodone    Trazodone And Nefazodone Cough      Medication List        Accurate as of 11/11/17  3:34 PM. Always use your most recent med list.          albuterol 108 (90 Base) MCG/ACT inhaler Commonly known as:  PROVENTIL HFA;VENTOLIN HFA Inhale 2 puffs into the lungs every 6 (six) hours as needed for wheezing or shortness of breath.   amLODipine 10 MG tablet Commonly known as:  NORVASC TAKE 1 TABLET(10 MG) BY MOUTH DAILY   carvedilol 6.25 MG tablet Commonly known as:  COREG TAKE 1 TABLET BY  MOUTH TWICE DAILY WITH MEALS   citalopram 20 MG tablet Commonly known as:  CELEXA TAKE 1 TABLET BY MOUTH DAILY   fenofibrate 145 MG tablet Commonly known as:  TRICOR TAKE 1 TABLET(145 MG) BY MOUTH DAILY   fluticasone 50 MCG/ACT nasal spray Commonly known as:  FLONASE Place 1 spray into both nostrils daily.   hydrochlorothiazide 25 MG tablet Commonly known as:  HYDRODIURIL Take 1 tablet (25 mg total) by mouth daily.   Insulin Pen Needle 31G X 5 MM Misc Use to inject insulin twice per day as directed with rx for bByetta.  DX E11.09   lisinopril 40 MG tablet Commonly known as:  PRINIVIL,ZESTRIL TAKE 1 TABLET(40 MG) BY MOUTH DAILY   metFORMIN 500 MG 24 hr tablet Commonly known as:  GLUCOPHAGE-XR TAKE 4 TABLETS BY MOUTH DAILY WITH SUPPER   omeprazole 20 MG capsule Commonly known as:  PRILOSEC Take 1 capsule (20 mg total) by mouth daily before breakfast.   ONE TOUCH LANCETS Misc Use to help check blood sugar twice a day   ONE TOUCH ULTRA TEST test strip Generic drug:  glucose blood CHECK BLOOD SUGAR BEFORE BREAKFAST AND AT BEDTIME   pravastatin 20 MG tablet Commonly known as:  PRAVACHOL TAKE 1 TABLET BY MOUTH DAILY   TRULICITY 1.5 MG/0.5ML Sopn Generic drug:  Dulaglutide INJECT 1.5MG  UNDER THE SKIN EVERY WEEK AS DIRECTED       Allergies:  Allergies  Allergen Reactions  . Oxycodone   . Trazodone And Nefazodone Cough    Past Medical History:  Diagnosis Date  . Abnormal electrocardiogram    consistent with LVH hypertrophy  . ALLERGIC RHINITIS   . ARTHRITIS   . DIABETES MELLITUS, TYPE II    follows with endo  . HYPERTENSION   . HYPERTRIGLYCERIDEMIA   . SLEEP APNEA     Past Surgical History:  Procedure Laterality Date  . ABDOMINAL HYSTERECTOMY    . HAND TENDON SURGERY    . NASAL POLYP SURGERY      Family History  Problem Relation Age of Onset  . Cancer Mother        Ovarian Cancer  . Hyperlipidemia Mother   . Hypertension Mother   . Diabetes Other        1st degree relative    Social History:  reports that she quit smoking about 4 years ago. She smoked 1.00 pack per day. She has never used smokeless tobacco. She reports that she drinks alcohol. She reports that she does not use drugs.    Review of Systems    Lipid history: On pravastatin for several years, has had previously high triglycerides also which are now better controlled with adding fenofibrate     Lab Results  Component Value Date   CHOL 162 11/08/2017   HDL 64.00 11/08/2017   LDLCALC 75 11/08/2017    LDLDIRECT 74.0 06/10/2017   TRIG 115.0 11/08/2017   CHOLHDL 3 11/08/2017             Hypertension: Treated with medications for 6-7 years, On 40 mg lisinopril, amlodipine and HCTZ; followed by PCP annually   BP Readings from Last 3 Encounters:  11/11/17 140/72  06/13/17 104/72  05/01/17 (!) 144/70      LABS:  Lab on 11/08/2017  Component Date Value Ref Range Status  . Microalb, Ur 11/08/2017 0.8  0.0 - 1.9 mg/dL Final  . Creatinine,U 16/01/9603 81.2  mg/dL Final  . Microalb Creat Ratio 11/08/2017 0.9  0.0 - 30.0  mg/g Final  . Cholesterol 11/08/2017 162  0 - 200 mg/dL Final   ATP III Classification       Desirable:  < 200 mg/dL               Borderline High:  200 - 239 mg/dL          High:  > = 098 mg/dL  . Triglycerides 11/08/2017 115.0  0.0 - 149.0 mg/dL Final   Normal:  <119 mg/dLBorderline High:  150 - 199 mg/dL  . HDL 11/08/2017 64.00  >39.00 mg/dL Final  . VLDL 14/78/2956 23.0  0.0 - 40.0 mg/dL Final  . LDL Cholesterol 11/08/2017 75  0 - 99 mg/dL Final  . Total CHOL/HDL Ratio 11/08/2017 3   Final                  Men          Women1/2 Average Risk     3.4          3.3Average Risk          5.0          4.42X Average Risk          9.6          7.13X Average Risk          15.0          11.0                      . NonHDL 11/08/2017 97.84   Final   NOTE:  Non-HDL goal should be 30 mg/dL higher than patient's LDL goal (i.e. LDL goal of < 70 mg/dL, would have non-HDL goal of < 100 mg/dL)  . Sodium 11/08/2017 139  135 - 145 mEq/L Final  . Potassium 11/08/2017 3.5  3.5 - 5.1 mEq/L Final  . Chloride 11/08/2017 101  96 - 112 mEq/L Final  . CO2 11/08/2017 29  19 - 32 mEq/L Final  . Glucose, Bld 11/08/2017 211* 70 - 99 mg/dL Final  . BUN 21/30/8657 11  6 - 23 mg/dL Final  . Creatinine, Ser 11/08/2017 0.81  0.40 - 1.20 mg/dL Final  . Total Bilirubin 11/08/2017 0.3  0.2 - 1.2 mg/dL Final  . Alkaline Phosphatase 11/08/2017 33* 39 - 117 U/L Final  . AST 11/08/2017 17  0 - 37 U/L Final   . ALT 11/08/2017 24  0 - 35 U/L Final  . Total Protein 11/08/2017 7.1  6.0 - 8.3 g/dL Final  . Albumin 84/69/6295 4.3  3.5 - 5.2 g/dL Final  . Calcium 28/41/3244 9.8  8.4 - 10.5 mg/dL Final  . GFR 05/02/7251 91.90  >60.00 mL/min Final  . Hgb A1c MFr Bld 11/08/2017 6.6* 4.6 - 6.5 % Final   Glycemic Control Guidelines for People with Diabetes:Non Diabetic:  <6%Goal of Therapy: <7%Additional Action Suggested:  >8%     Physical Examination:  BP 140/72 (Cuff Size: Normal)   Pulse 81   Ht 5' 7.75" (1.721 m)   Wt 178 lb 6.4 oz (80.9 kg)   SpO2 98%   BMI 27.33 kg/m         ASSESSMENT:  Diabetes type 2, non-insulin-dependent  See history of present illness for detailed discussion of  current management, blood sugar patterns and problems identified  A1c is now 6.6, previously 6.5  She is on a regimen of metformin and Trulicity and has been regular with her medications  Even though she has  done poorly with her diet and exercise regimen and has gained some weight her glucose control overall is about the same She is likely having more high postprandial readings at times if she is eating more carbohydrate or unbalanced meals   HYPERTENSION: Blood pressure is managed by PCP, it is high normal today, likely from anxiety  LIPIDS: She has better control of triglycerides with adding fenofibrate and she will continue However needs to continue to improve diet and work on weight loss   PLAN:   She agrees to start an indoor exercise program  No change in diabetes regimen  She does need to have a protein and lower carbohydrate intake with every meal  Continue fenofibrate with pravastatin for lipids  Follow-up with PCP for general care and hypertension  Follow-up in 6 months  Annual eye exams    Patient Instructions  Exercise daily  Low carb more protein  Check blood sugars on waking up  2/7  Also check blood sugars about 2 hours after a meal and do this after different  meals by rotation  Recommended blood sugar levels on waking up is 90-130 and about 2 hours after meal is 130-160  Please bring your blood sugar monitor to each visit, thank you    Reather LittlerAjay Mizani Dilday 11/11/2017, 3:34 PM   Note: This office note was prepared with Dragon voice recognition system technology. Any transcriptional errors that result from this process are unintentional.

## 2017-11-13 ENCOUNTER — Other Ambulatory Visit: Payer: Self-pay | Admitting: Endocrinology

## 2017-11-14 ENCOUNTER — Other Ambulatory Visit: Payer: Self-pay | Admitting: Endocrinology

## 2017-11-20 ENCOUNTER — Other Ambulatory Visit: Payer: Self-pay | Admitting: Internal Medicine

## 2017-11-26 ENCOUNTER — Ambulatory Visit: Payer: BLUE CROSS/BLUE SHIELD | Admitting: Internal Medicine

## 2017-11-26 ENCOUNTER — Other Ambulatory Visit: Payer: Self-pay | Admitting: Internal Medicine

## 2017-11-26 ENCOUNTER — Encounter: Payer: Self-pay | Admitting: Internal Medicine

## 2017-11-26 VITALS — BP 130/72 | HR 70 | Temp 98.1°F | Resp 16 | Wt 180.0 lb

## 2017-11-26 DIAGNOSIS — I10 Essential (primary) hypertension: Secondary | ICD-10-CM | POA: Diagnosis not present

## 2017-11-26 DIAGNOSIS — F4321 Adjustment disorder with depressed mood: Secondary | ICD-10-CM | POA: Diagnosis not present

## 2017-11-26 DIAGNOSIS — K219 Gastro-esophageal reflux disease without esophagitis: Secondary | ICD-10-CM | POA: Diagnosis not present

## 2017-11-26 DIAGNOSIS — E1165 Type 2 diabetes mellitus with hyperglycemia: Secondary | ICD-10-CM

## 2017-11-26 DIAGNOSIS — F419 Anxiety disorder, unspecified: Secondary | ICD-10-CM

## 2017-11-26 DIAGNOSIS — B029 Zoster without complications: Secondary | ICD-10-CM

## 2017-11-26 MED ORDER — CITALOPRAM HYDROBROMIDE 20 MG PO TABS: 20.0000 mg | ORAL_TABLET | Freq: Every day | ORAL | 1 refills | Status: DC

## 2017-11-26 MED ORDER — VALACYCLOVIR HCL 1 G PO TABS: 1000.0000 mg | ORAL_TABLET | Freq: Three times a day (TID) | ORAL | 0 refills | Status: AC

## 2017-11-26 NOTE — Progress Notes (Signed)
Subjective:    Patient ID: Terry Hull, female    DOB: 11/16/1955, 62 y.o.   MRN: [id number]  HPI The patient is here for follow up.  Itchy rash - started 4 days ago.  The rash is on her upper left arm.  She is using desonide cream multiple times a day and it seems to help.  She has noticed a couple of new lesions.  She had left hand pain as well, but did not think that was related.  She denies any numbness or tingling.  She denies any back, chest or neck pain.    Hypertension: She is taking her medication daily. She is compliant with a low sodium diet.  She denies chest pain, shortness of breath and regular headaches. She is not exercising regularly.  She does not monitor her blood pressure at home.    Diabetes: she sees Dr Lucianne Muss.  She is taking her medication daily as prescribed. She is compliant with a diabetic diet. She is not exercising regularly. She checks her feet daily and denies foot lesions. She is up-to-date with an ophthalmology examination.   Anxiety, depression due to death of her son: She is taking her medication daily as prescribed. She denies any side effects from the medication. She feels her anxiety and depression are both well controlled and she is happy with her current dose of medication.   GERD:  She is taking her medication as needed.  She denies any GERD symptoms and feels her GERD is well controlled.    Medications and allergies reviewed with patient and updated if appropriate.  Patient Active Problem List   Diagnosis Date Noted  . GERD (gastroesophageal reflux disease) 05/01/2017  . Grieving 01/30/2017  . Nontraumatic partial bilateral rotator cuff tear 06/28/2016  . Cervical radiculopathy 06/28/2016  . Cervical paraspinal muscle spasm 06/18/2016  . Sleep difficulties 03/09/2016  . Hyperlipidemia 03/09/2016  . Type 2 diabetes mellitus with hyperglycemia, without long-term current use of insulin (HCC) 03/07/2015  . Anxiety 05/29/2010  . Uncontrolled  hypertension 02/28/2010  . Allergic rhinitis 02/28/2010  . Arthritis 02/28/2010  . SLEEP APNEA 02/08/2010  . ATHEROSLERO NATV ART EXTREM W/INTERMIT CLAUDICAT 02/07/2010    Current Outpatient Medications on File Prior to Visit  Medication Sig Dispense Refill  . albuterol (PROVENTIL HFA;VENTOLIN HFA) 108 (90 BASE) MCG/ACT inhaler Inhale 2 puffs into the lungs every 6 (six) hours as needed for wheezing or shortness of breath. 1 Inhaler 2  . amLODipine (NORVASC) 10 MG tablet TAKE 1 TABLET BY MOUTH DAILY 90 tablet 0  . carvedilol (COREG) 6.25 MG tablet TAKE 1 TABLET BY MOUTH TWICE DAILY WITH MEALS 180 tablet 0  . citalopram (CELEXA) 20 MG tablet TAKE 1 TABLET BY MOUTH DAILY 90 tablet 0  . fenofibrate (TRICOR) 145 MG tablet TAKE 1 TABLET(145 MG) BY MOUTH DAILY 30 tablet 0  . fluticasone (FLONASE) 50 MCG/ACT nasal spray Place 1 spray into both nostrils daily. 16 g 2  . hydrochlorothiazide (HYDRODIURIL) 25 MG tablet Take 1 tablet (25 mg total) by mouth daily. 90 tablet 3  . Insulin Pen Needle 31G X 5 MM MISC Use to inject insulin twice per day as directed with rx for bByetta. DX E11.09 200 each 3  . lisinopril (PRINIVIL,ZESTRIL) 40 MG tablet TAKE 1 TABLET(40 MG) BY MOUTH DAILY 90 tablet 3  . metFORMIN (GLUCOPHAGE-XR) 500 MG 24 hr tablet TAKE 4 TABLETS BY MOUTH DAILY WITH SUPPER 120 tablet 0  . omeprazole (PRILOSEC) 20 MG  capsule Take 1 capsule (20 mg total) by mouth daily before breakfast. 30 capsule 5  . ONE TOUCH LANCETS MISC Use to help check blood sugar twice a day 60 each 5  . ONE TOUCH ULTRA TEST test strip CHECK BLOOD SUGAR BEFORE BREAKFAST AND AT BEDTIME 100 each 0  . pravastatin (PRAVACHOL) 20 MG tablet TAKE 1 TABLET BY MOUTH DAILY 90 tablet 1  . TRULICITY 1.5 MG/0.5ML SOPN INJECT 1.5MG  UNDER THE SKIN EVERY WEEK AS DIRECTED 10 mL 0   No current facility-administered medications on file prior to visit.     Past Medical History:  Diagnosis Date  . Abnormal electrocardiogram     consistent with LVH hypertrophy  . ALLERGIC RHINITIS   . ARTHRITIS   . DIABETES MELLITUS, TYPE II    follows with endo  . HYPERTENSION   . HYPERTRIGLYCERIDEMIA   . SLEEP APNEA     Past Surgical History:  Procedure Laterality Date  . ABDOMINAL HYSTERECTOMY    . HAND TENDON SURGERY    . NASAL POLYP SURGERY      Social History   Socioeconomic History  . Marital status: Married    Spouse name: Not on file  . Number of children: 1  . Years of education: Not on file  . Highest education level: Not on file  Occupational History  . 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 Financial resource strain: Not on file  . Food insecurity:    Worry: Not on file    Inability: Not on file  . Transportation needs:    Medical: Not on file    Non-medical: Not on file  Tobacco Use  . Smoking status: Former Smoker    Packs/day: 1.00    Last attempt to quit: 12/29/2012    Years since quitting: 4.9  . Smokeless tobacco: Never Used  Substance and Sexual Activity  . Alcohol use: Yes    Comment: 4 per week  . Drug use: No  . Sexual activity: Not on file  Lifestyle  . Physical activity:    Days per week: Not on file    Minutes per session: Not on file  . Stress: Not on file  Relationships  . Social connections:    Talks on phone: Not on file    Gets together: Not on file    Attends religious service: Not on file    Active member of club or organization: Not on file    Attends meetings of clubs or organizations: Not on file    Relationship status: Not on file  Other Topics Concern  . Not on file  Social History Narrative   She has 1 child and 3 grandchildren   She works at [employer]       Family History  Problem Relation Age of Onset  . Cancer Mother        Ovarian Cancer  . Hyperlipidemia Mother   . Hypertension Mother   . Diabetes Other        1st degree relative    Review of Systems    Constitutional: Positive for chills (at night) and fatigue. Negative for fever.  HENT: Positive for congestion. Negative for ear pain, sinus pain and sore throat.   Respiratory: Positive for cough (dry) and wheezing (over the weekend). Negative for shortness of breath.   Cardiovascular: Positive for palpitations and leg swelling (little). Negative for chest pain.  Musculoskeletal:  Left hand pain  Skin: Positive for rash.  Neurological: Positive for light-headedness and headaches.       Objective:   Vitals:   11/26/17 1352  BP: 130/72  Pulse: 70  Resp: 16  Temp: 98.1 F (36.7 C)  SpO2: 96%   BP Readings from Last 3 Encounters:  11/26/17 130/72  11/11/17 140/72  06/13/17 104/72   Wt Readings from Last 3 Encounters:  11/26/17 180 lb (81.6 kg)  11/11/17 178 lb 6.4 oz (80.9 kg)  06/13/17 173 lb 3.2 oz (78.6 kg)   Body mass index is 27.57 kg/m.   Physical Exam    Constitutional: Appears well-developed and well-nourished. No distress.  HENT:  Head: Normocephalic and atraumatic.  Neck: Neck supple. No tracheal deviation present. No thyromegaly present.  No cervical lymphadenopathy Cardiovascular: Normal rate, regular rhythm and normal heart sounds.   No murmur heard. No carotid bruit .  No edema Pulmonary/Chest: Effort normal and breath sounds normal. No respiratory distress. No has no wheezes. No rales.  Skin: Skin is warm and dry. Not diaphoretic.  Cluster of papules/blisters left upper arm without open sores, drainage or generalized erythema, no other lesions Psychiatric: Normal mood and affect. Behavior is normal.      Assessment & Plan:    See Problem List for Assessment and Plan of chronic medical problems.

## 2017-11-26 NOTE — Assessment & Plan Note (Signed)
The rash on her left upper arm is consistent with shingles Discussed that her left hand pain is likely related to the shingles Since she did see a couple of new lesions I will go ahead and start Valtrex 3 times daily x1 week She deferred gabapentin at this time, but will call if her pain worsens

## 2017-11-26 NOTE — Patient Instructions (Addendum)
Schedule your eye exam, mammogram and foot doctor appointments.      Medications reviewed and updated.  Changes include starting valtrex for the shingles.    Your prescription(s) have been submitted to your pharmacy. Please take as directed and contact our office if you believe you are having problem(s) with the medication(s).   Please followup in 6 months     Shingles Shingles, which is also known as herpes zoster, is an infection that causes a painful skin rash and fluid-filled blisters. Shingles is not related to genital herpes, which is a sexually transmitted infection. Shingles only develops in people who:  Have had chickenpox.  Have received the chickenpox vaccine. (This is rare.)  What are the causes? Shingles is caused by varicella-zoster virus (VZV). This is the same virus that causes chickenpox. After exposure to VZV, the virus stays in the body in an inactive (dormant) state. Shingles develops if the virus reactivates. This can happen many years after the initial exposure to VZV. It is not known what causes this virus to reactivate. What increases the risk? People who have had chickenpox or received the chickenpox vaccine are at risk for shingles. Infection is more common in people who:  Are older than age 30.  Have a weakened defense (immune) system, such as those with HIV, AIDS, or cancer.  Are taking medicines that weaken the immune system, such as transplant medicines.  Are under great stress.  What are the signs or symptoms? Early symptoms of this condition include itching, tingling, and pain in an area on your skin. Pain may be described as burning, stabbing, or throbbing. A few days or weeks after symptoms start, a painful red rash appears, usually on one side of the body in a bandlike or beltlike pattern. The rash eventually turns into fluid-filled blisters that break open, scab over, and dry up in about 2-3 weeks. At any time during the infection, you may  also develop:  A fever.  Chills.  A headache.  An upset stomach.  How is this diagnosed? This condition is diagnosed with a skin exam. Sometimes, skin or fluid samples are taken from the blisters before a diagnosis is made. These samples are examined under a microscope or sent to a lab for testing. How is this treated? There is no specific cure for this condition. Your health care provider will probably prescribe medicines to help you manage pain, recover more quickly, and avoid long-term problems. Medicines may include:  Antiviral drugs.  Anti-inflammatory drugs.  Pain medicines.  If the area involved is on your face, you may be referred to a specialist, such as an eye doctor (ophthalmologist) or an ear, nose, and throat (ENT) doctor to help you avoid eye problems, chronic pain, or disability. Follow these instructions at home: Medicines  Take medicines only as directed by your health care provider.  Apply an anti-itch or numbing cream to the affected area as directed by your health care provider. Blister and Rash Care  Take a cool bath or apply cool compresses to the area of the rash or blisters as directed by your health care provider. This may help with pain and itching.  Keep your rash covered with a loose bandage (dressing). Wear loose-fitting clothing to help ease the pain of material rubbing against the rash.  Keep your rash and blisters clean with mild soap and cool water or as directed by your health care provider.  Check your rash every day for signs of infection. These include redness,  swelling, and pain that lasts or increases.  Do not pick your blisters.  Do not scratch your rash. General instructions  Rest as directed by your health care provider.  Keep all follow-up visits as directed by your health care provider. This is important.  Until your blisters scab over, your infection can cause chickenpox in people who have never had it or been vaccinated  against it. To prevent this from happening, avoid contact with other people, especially: ? Babies. ? Pregnant women. ? Children who have eczema. ? Elderly people who have transplants. ? People who have chronic illnesses, such as leukemia or AIDS. Contact a health care provider if:  Your pain is not relieved with prescribed medicines.  Your pain does not get better after the rash heals.  Your rash looks infected. Signs of infection include redness, swelling, and pain that lasts or increases. Get help right away if:  The rash is on your face or nose.  You have facial pain, pain around your eye area, or loss of feeling on one side of your face.  You have ear pain or you have ringing in your ear.  You have loss of taste.  Your condition gets worse. This information is not intended to replace advice given to you by your health care provider. Make sure you discuss any questions you have with your health care provider. Document Released: 04/16/2005 Document Revised: 12/11/2015 Document Reviewed: 02/25/2014 Elsevier Interactive Patient Education  2018 [employer]

## 2017-11-26 NOTE — Assessment & Plan Note (Signed)
Following with Dr. Lucianne MussKumar Sugars well controlled-A1c less than 7 Continue current medication Stressed the importance of starting regular exercise

## 2017-11-26 NOTE — Assessment & Plan Note (Signed)
Taking medication prn only GERD controlled

## 2017-11-26 NOTE — Assessment & Plan Note (Signed)
Controlled, stable Continue current dose of medication  

## 2017-11-26 NOTE — Assessment & Plan Note (Signed)
Depression and anxiety well controlled She is approaching the one-year mark of her son's death and overall feels she is doing well all things considering Continue citalopram at current dose Follow-up in 6 months, sooner if needed

## 2017-11-26 NOTE — Assessment & Plan Note (Signed)
BP well controlled Current regimen effective and well tolerated Continue current medications at current doses CMP just done-normal

## 2017-12-10 ENCOUNTER — Encounter: Payer: Self-pay | Admitting: Internal Medicine

## 2017-12-11 ENCOUNTER — Other Ambulatory Visit: Payer: Self-pay | Admitting: Endocrinology

## 2017-12-11 ENCOUNTER — Encounter: Payer: Self-pay | Admitting: Internal Medicine

## 2017-12-12 MED ORDER — GABAPENTIN 100 MG PO CAPS: 100.0000 mg | ORAL_CAPSULE | Freq: Two times a day (BID) | ORAL | 3 refills | Status: AC

## 2017-12-12 NOTE — Addendum Note (Signed)
Addended by: Pincus SanesBURNS, Danessa Mensch J on: 12/12/2017 01:00 PM   Modules accepted: Orders

## 2017-12-19 ENCOUNTER — Other Ambulatory Visit: Payer: Self-pay | Admitting: Internal Medicine

## 2017-12-23 ENCOUNTER — Other Ambulatory Visit: Payer: Self-pay | Admitting: Internal Medicine

## 2018-01-10 ENCOUNTER — Other Ambulatory Visit: Payer: Self-pay | Admitting: Endocrinology

## 2018-01-21 ENCOUNTER — Other Ambulatory Visit: Payer: Self-pay | Admitting: Internal Medicine

## 2018-02-21 ENCOUNTER — Other Ambulatory Visit: Payer: Self-pay | Admitting: Internal Medicine

## 2018-03-16 ENCOUNTER — Other Ambulatory Visit: Payer: Self-pay | Admitting: Endocrinology

## 2018-03-30 ENCOUNTER — Other Ambulatory Visit: Payer: Self-pay | Admitting: Internal Medicine

## 2018-04-13 ENCOUNTER — Other Ambulatory Visit: Payer: Self-pay | Admitting: Internal Medicine

## 2018-04-15 ENCOUNTER — Other Ambulatory Visit: Payer: Self-pay | Admitting: Internal Medicine

## 2018-05-06 ENCOUNTER — Other Ambulatory Visit: Payer: Self-pay | Admitting: Endocrinology

## 2018-05-12 ENCOUNTER — Other Ambulatory Visit: Payer: BLUE CROSS/BLUE SHIELD

## 2018-05-13 ENCOUNTER — Other Ambulatory Visit: Payer: Self-pay | Admitting: Endocrinology

## 2018-05-15 ENCOUNTER — Ambulatory Visit: Payer: BLUE CROSS/BLUE SHIELD | Admitting: Endocrinology

## 2018-05-19 ENCOUNTER — Other Ambulatory Visit: Payer: Self-pay | Admitting: Internal Medicine

## 2018-05-31 NOTE — Patient Instructions (Signed)
  Tests ordered today. Your results will be released to MyChart (or called to you) after review, usually within 72hours after test completion. If any changes need to be made, you will be notified at that same time.  All other Health Maintenance issues reviewed.   All recommended immunizations and age-appropriate screenings are up-to-date or discussed.  No immunizations administered today.   Medications reviewed and updated.  Changes include :     Your prescription(s) have been submitted to your pharmacy. Please take as directed and contact our office if you believe you are having problem(s) with the medication(s).  A referral was ordered for   Please followup in    

## 2018-05-31 NOTE — Progress Notes (Signed)
Subjective:    Patient ID: Terry Hull, female    DOB: [birth year], 63 y.o.   MRN: [id number]  HPI The patient is here for follow up.    Medications and allergies reviewed with patient and updated if appropriate.  Patient Active Problem List   Diagnosis Date Noted  . Herpes zoster without complication 11/26/2017  . GERD (gastroesophageal reflux disease) 05/01/2017  . Grieving 01/30/2017  . Nontraumatic partial bilateral rotator cuff tear 06/28/2016  . Cervical radiculopathy 06/28/2016  . Cervical paraspinal muscle spasm 06/18/2016  . Sleep difficulties 03/09/2016  . Hyperlipidemia 03/09/2016  . Type 2 diabetes mellitus with hyperglycemia, without long-term current use of insulin (HCC) 03/07/2015  . Anxiety 05/29/2010  . Essential hypertension 02/28/2010  . Allergic rhinitis 02/28/2010  . Arthritis 02/28/2010  . SLEEP APNEA 02/08/2010  . ATHEROSLERO NATV ART EXTREM W/INTERMIT CLAUDICAT 02/07/2010    Current Outpatient Medications on File Prior to Visit  Medication Sig Dispense Refill  . albuterol (PROVENTIL HFA;VENTOLIN HFA) 108 (90 BASE) MCG/ACT inhaler Inhale 2 puffs into the lungs every 6 (six) hours as needed for wheezing or shortness of breath. 1 Inhaler 2  . amLODipine (NORVASC) 10 MG tablet TAKE 1 TABLET BY MOUTH DAILY 90 tablet 0  . carvedilol (COREG) 6.25 MG tablet TAKE 1 TABLET BY MOUTH TWICE DAILY WITH MEALS 180 tablet 0  . citalopram (CELEXA) 20 MG tablet TAKE 1 TABLET(20 MG) BY MOUTH DAILY 90 tablet 1  . fenofibrate (TRICOR) 145 MG tablet TAKE 1 TABLET(145 MG) BY MOUTH DAILY 30 tablet 3  . fluticasone (FLONASE) 50 MCG/ACT nasal spray Place 1 spray into both nostrils daily. 16 g 2  . gabapentin (NEURONTIN) 100 MG capsule Take 1 capsule (100 mg total) by mouth 2 (two) times daily. 60 capsule 3  . hydrochlorothiazide (HYDRODIURIL) 25 MG tablet TAKE 1 TABLET BY MOUTH EVERY DAY 90 tablet 1  . Insulin Pen Needle 31G X 5 MM MISC Use to inject insulin twice per day  as directed with rx for bByetta. DX E11.09 200 each 3  . lisinopril (PRINIVIL,ZESTRIL) 40 MG tablet TAKE 1 TABLET(40 MG) BY MOUTH DAILY 90 tablet 3  . metFORMIN (GLUCOPHAGE-XR) 500 MG 24 hr tablet TAKE 4 TABLETS BY MOUTH DAILY WITH SUPPER 120 tablet 3  . omeprazole (PRILOSEC) 20 MG capsule TAKE 1 CAPSULE(20 MG) BY MOUTH DAILY BEFORE BREAKFAST 30 capsule 5  . ONE TOUCH LANCETS MISC Use to help check blood sugar twice a day 60 each 5  . ONE TOUCH ULTRA TEST test strip CHECK BLOOD SUGAR BEFORE BREAKFAST AND AT BEDTIME 100 each 0  . pravastatin (PRAVACHOL) 20 MG tablet TAKE 1 TABLET BY MOUTH DAILY 90 tablet 1  . TRULICITY 1.5 MG/0.5ML SOPN INJECT 1.5 MG UNDER THE SKIN EVERY WEEK AS DIRECTED 2 mL 11  . valACYclovir (VALTREX) 1000 MG tablet Take 1 tablet (1,000 mg total) by mouth 3 (three) times daily. 21 tablet 0   No current facility-administered medications on file prior to visit.     Past Medical History:  Diagnosis Date  . Abnormal electrocardiogram    consistent with LVH hypertrophy  . ALLERGIC RHINITIS   . ARTHRITIS   . DIABETES MELLITUS, TYPE II    follows with endo  . HYPERTENSION   . HYPERTRIGLYCERIDEMIA   . SLEEP APNEA     Past Surgical History:  Procedure Laterality Date  . ABDOMINAL HYSTERECTOMY    . HAND TENDON SURGERY    . NASAL POLYP SURGERY  Social History   Socioeconomic History  . Marital status: Married    Spouse name: Not on file  . Number of children: 1  . Years of education: Not on file  . Highest education level: Not on file  Occupational History  . 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 Financial resource strain: Not on file  . Food insecurity:    Worry: Not on file    Inability: Not on file  . Transportation needs:    Medical: Not on file    Non-medical: Not on file  Tobacco Use  . Smoking status: Former Smoker    Packs/day: 1.00    Last attempt to quit: 12/29/2012    Years since  quitting: 5.4  . Smokeless tobacco: Never Used  Substance and Sexual Activity  . Alcohol use: Yes    Comment: 4 per week  . Drug use: No  . Sexual activity: Not on file  Lifestyle  . Physical activity:    Days per week: Not on file    Minutes per session: Not on file  . Stress: Not on file  Relationships  . Social connections:    Talks on phone: Not on file    Gets together: Not on file    Attends religious service: Not on file    Active member of club or organization: Not on file    Attends meetings of clubs or organizations: Not on file    Relationship status: Not on file  Other Topics Concern  . Not on file  Social History Narrative   She has 1 child and 3 grandchildren   She works at [employer]       Family History  Problem Relation Age of Onset  . Cancer Mother        Ovarian Cancer  . Hyperlipidemia Mother   . Hypertension Mother   . Diabetes Other        1st degree relative    Review of Systems     Objective:  There were no vitals filed for this visit. BP Readings from Last 3 Encounters:  11/26/17 130/72  11/11/17 140/72  06/13/17 104/72   Wt Readings from Last 3 Encounters:  11/26/17 180 lb (81.6 kg)  11/11/17 178 lb 6.4 oz (80.9 kg)  06/13/17 173 lb 3.2 oz (78.6 kg)   There is no height or weight on file to calculate BMI.   Physical Exam    Constitutional: Appears well-developed and well-nourished. No distress.  HENT:  Head: Normocephalic and atraumatic.  Neck: Neck supple. No tracheal deviation present. No thyromegaly present.  No cervical lymphadenopathy Cardiovascular: Normal rate, regular rhythm and normal heart sounds.   No murmur heard. No carotid bruit .  No edema Pulmonary/Chest: Effort normal and breath sounds normal. No respiratory distress. No has no wheezes. No rales.  Skin: Skin is warm and dry. Not diaphoretic.  Psychiatric: Normal mood and affect. Behavior is normal.      Assessment & Plan:    See  Problem List for Assessment and Plan of chronic medical problems.   This encounter was created in error - please disregard.

## 2018-06-02 ENCOUNTER — Encounter: Payer: BLUE CROSS/BLUE SHIELD | Admitting: Internal Medicine

## 2018-06-14 ENCOUNTER — Other Ambulatory Visit: Payer: Self-pay | Admitting: Internal Medicine

## 2018-06-23 ENCOUNTER — Other Ambulatory Visit: Payer: BLUE CROSS/BLUE SHIELD

## 2018-06-25 ENCOUNTER — Ambulatory Visit: Payer: BLUE CROSS/BLUE SHIELD | Admitting: Endocrinology

## 2018-08-05 ENCOUNTER — Other Ambulatory Visit: Payer: Self-pay | Admitting: Internal Medicine

## 2018-08-20 ENCOUNTER — Other Ambulatory Visit: Payer: Self-pay | Admitting: Internal Medicine

## 2018-09-09 ENCOUNTER — Other Ambulatory Visit: Payer: Self-pay | Admitting: Endocrinology

## 2018-09-10 ENCOUNTER — Other Ambulatory Visit: Payer: Self-pay

## 2018-09-10 ENCOUNTER — Telehealth: Payer: Self-pay | Admitting: Endocrinology

## 2018-09-10 MED ORDER — METFORMIN HCL ER 500 MG PO TB24: ORAL_TABLET | ORAL | 0 refills | Status: AC

## 2018-09-10 MED ORDER — DULAGLUTIDE 1.5 MG/0.5ML ~~LOC~~ SOAJ: SUBCUTANEOUS | 0 refills | Status: AC

## 2018-09-10 MED ORDER — FENOFIBRATE 145 MG PO TABS: ORAL_TABLET | ORAL | 0 refills | Status: AC

## 2018-09-10 MED ORDER — GLUCOSE BLOOD VI STRP: ORAL_STRIP | 0 refills | Status: AC

## 2018-09-10 NOTE — Telephone Encounter (Signed)
Okay to refill for 30 days.  Patient considered self dismissed

## 2018-09-10 NOTE — Telephone Encounter (Signed)
Pt has not been seen since July of 2019 and does not intend on returning for f/u r/t insurance restrictions. Are you okay with sending a 1 month supply of medication until pt can be seen by new MD?

## 2018-09-10 NOTE — Telephone Encounter (Signed)
Rx sent 

## 2018-09-10 NOTE — Telephone Encounter (Signed)
Patient has called in regards to her Rx refill denials. She is actually leaving our office due to having new insurance and it not being covered with [place]. Her new patient with her new doctor has been pushed out to June due to COVID-19. Patient is requesting enough pills until then.  Please Advise, Thanks  Metformin Fenofibrate  [facility]

## 2018-10-07 DIAGNOSIS — E785 Hyperlipidemia, unspecified: Secondary | ICD-10-CM | POA: Diagnosis not present

## 2018-10-07 DIAGNOSIS — E1165 Type 2 diabetes mellitus with hyperglycemia: Secondary | ICD-10-CM | POA: Diagnosis not present

## 2018-10-07 DIAGNOSIS — Z7984 Long term (current) use of oral hypoglycemic drugs: Secondary | ICD-10-CM | POA: Diagnosis not present

## 2018-10-07 DIAGNOSIS — E782 Mixed hyperlipidemia: Secondary | ICD-10-CM | POA: Diagnosis not present

## 2018-10-07 DIAGNOSIS — I10 Essential (primary) hypertension: Secondary | ICD-10-CM | POA: Diagnosis not present

## 2018-10-07 DIAGNOSIS — Z7689 Persons encountering health services in other specified circumstances: Secondary | ICD-10-CM | POA: Diagnosis not present

## 2018-10-13 ENCOUNTER — Other Ambulatory Visit: Payer: Self-pay | Admitting: Internal Medicine

## 2018-10-21 DIAGNOSIS — M2042 Other hammer toe(s) (acquired), left foot: Secondary | ICD-10-CM | POA: Diagnosis not present

## 2018-10-21 DIAGNOSIS — R200 Anesthesia of skin: Secondary | ICD-10-CM | POA: Diagnosis not present

## 2018-10-21 DIAGNOSIS — M2041 Other hammer toe(s) (acquired), right foot: Secondary | ICD-10-CM | POA: Diagnosis not present

## 2018-10-21 DIAGNOSIS — E1165 Type 2 diabetes mellitus with hyperglycemia: Secondary | ICD-10-CM | POA: Diagnosis not present

## 2018-11-15 ENCOUNTER — Other Ambulatory Visit: Payer: Self-pay | Admitting: Internal Medicine

## 2018-12-15 ENCOUNTER — Other Ambulatory Visit: Payer: Self-pay | Admitting: Internal Medicine

## 2019-01-12 ENCOUNTER — Other Ambulatory Visit: Payer: Self-pay | Admitting: Internal Medicine

## 2019-01-15 IMAGING — DX DG SHOULDER 2+V*R*
3 series · 3 of 3 positions shown · non-contrast
Comparison: None.

CLINICAL DATA: 60-year-old with 2 week history of recurrent right
shoulder pain. No known injuries.

EXAM:
RIGHT SHOULDER - 2+ VIEW

[grashey]
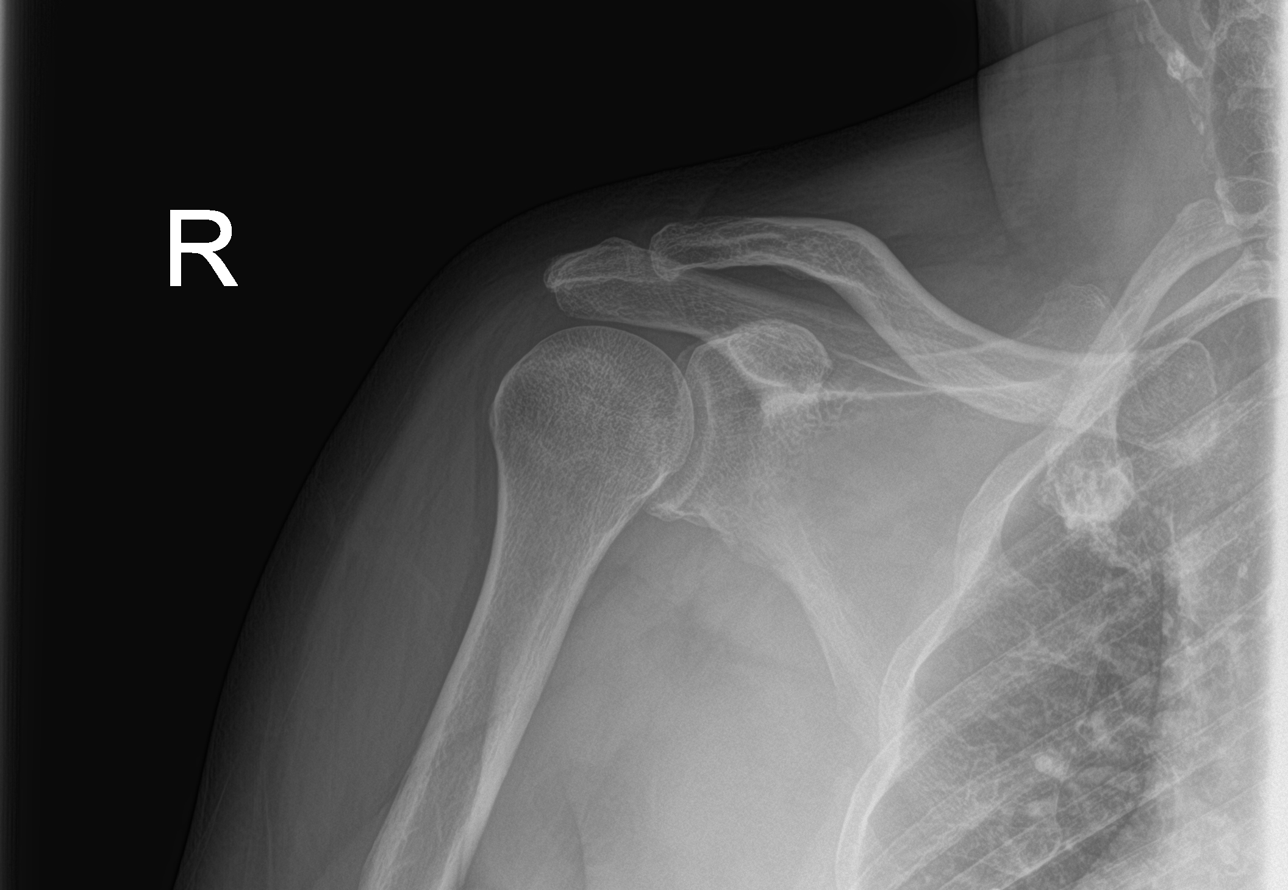

[y view]
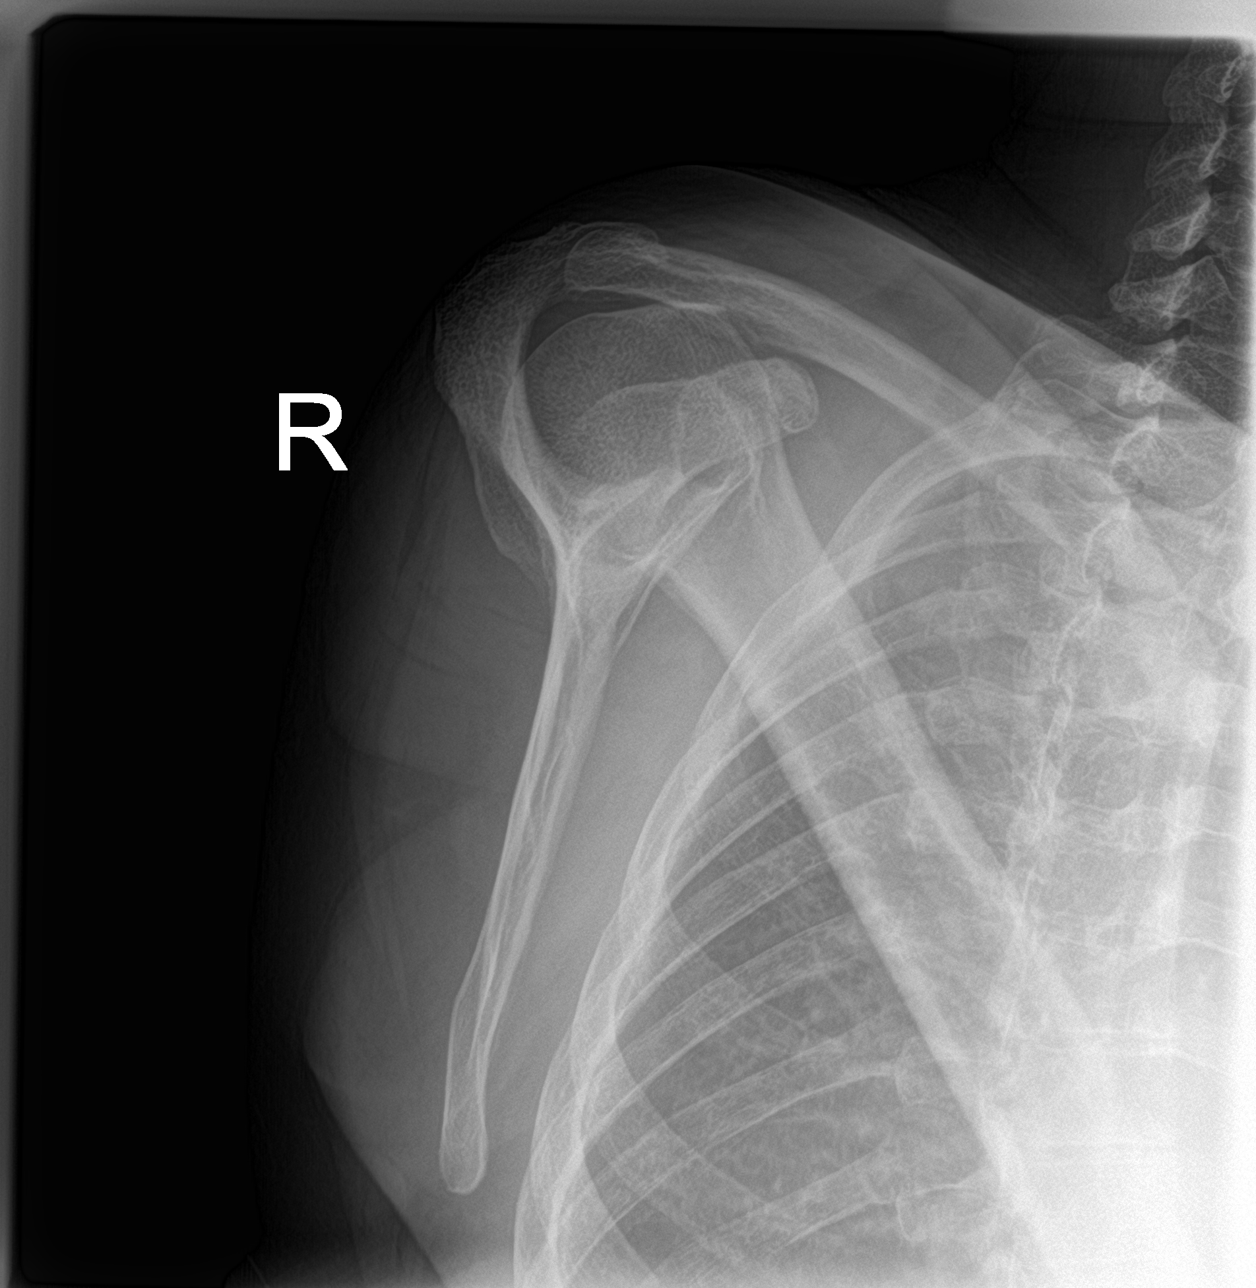

[shoulder axial]
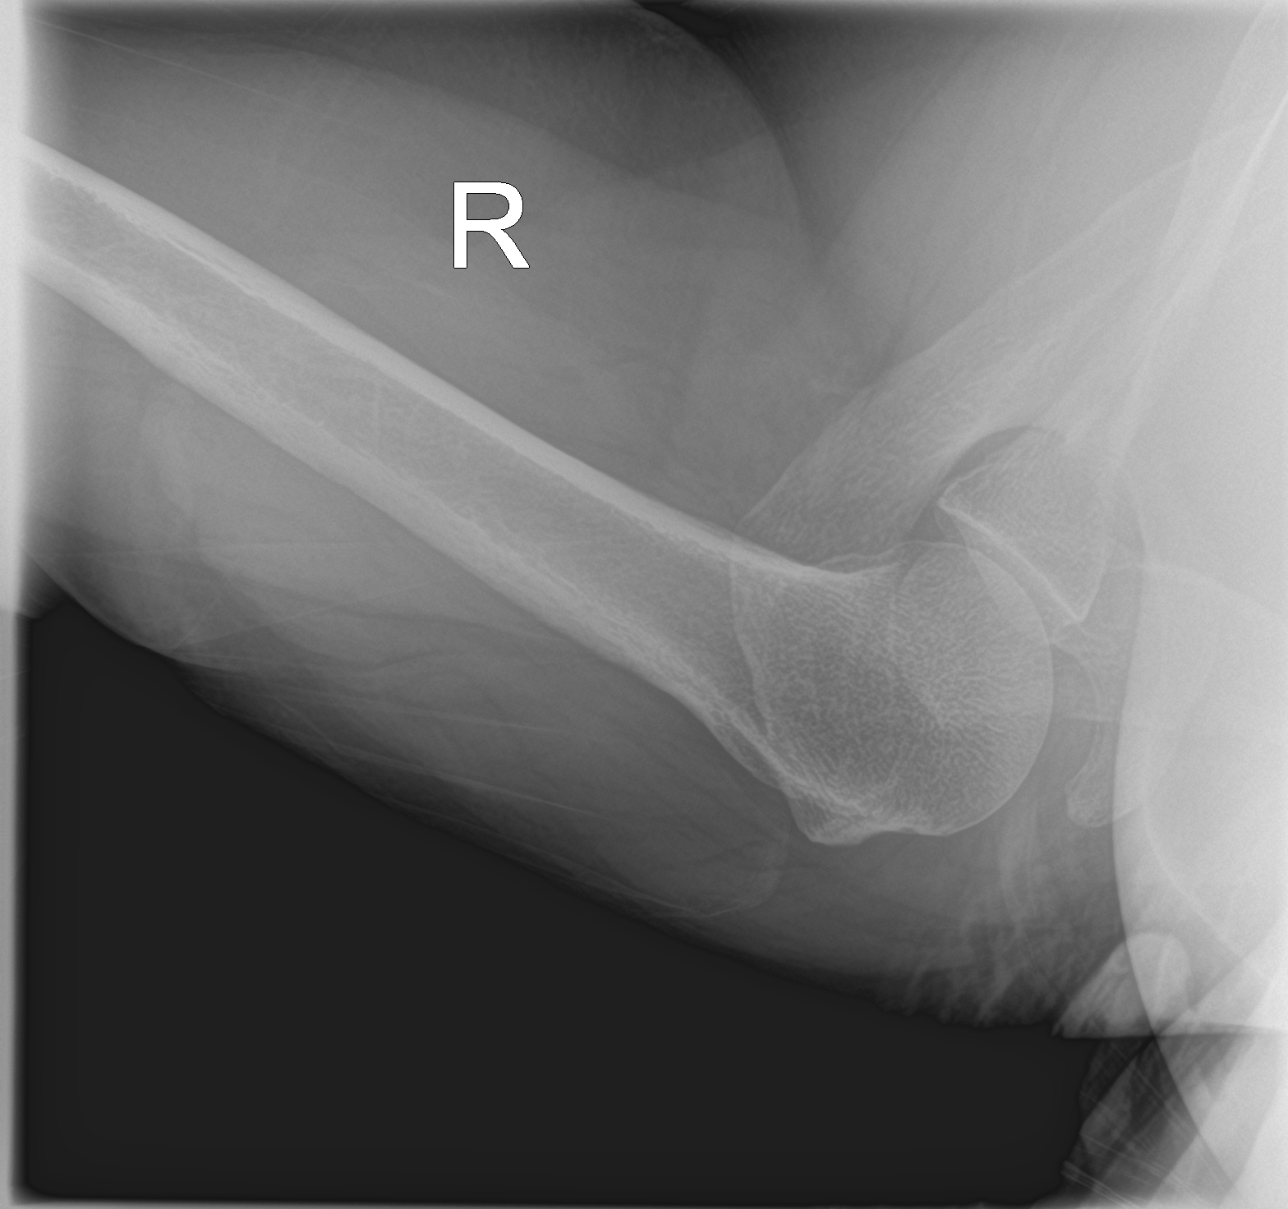

[3 of 3 positions shown; findings below may reference images not displayed]

FINDINGS: No evidence of acute fracture or glenohumeral dislocation.
Subacromial space well-preserved. Acromioclavicular joint intact
with minimal degenerative changes. Bone mineral density
well-preserved. No intrinsic osseous abnormalities.
IMPRESSION: 1. No acute or subacute osseous abnormality.
2. Minimal degenerative changes involving the AC joint.

## 2019-02-05 ENCOUNTER — Other Ambulatory Visit: Payer: Self-pay | Admitting: *Deleted

## 2019-02-05 MED ORDER — OMEPRAZOLE 20 MG PO CPDR: DELAYED_RELEASE_CAPSULE | ORAL | 2 refills | Status: DC

## 2019-02-06 DIAGNOSIS — Z7984 Long term (current) use of oral hypoglycemic drugs: Secondary | ICD-10-CM | POA: Diagnosis not present

## 2019-02-06 DIAGNOSIS — Z1321 Encounter for screening for nutritional disorder: Secondary | ICD-10-CM | POA: Diagnosis not present

## 2019-02-06 DIAGNOSIS — E782 Mixed hyperlipidemia: Secondary | ICD-10-CM | POA: Diagnosis not present

## 2019-02-06 DIAGNOSIS — I10 Essential (primary) hypertension: Secondary | ICD-10-CM | POA: Diagnosis not present

## 2019-02-06 DIAGNOSIS — Z1231 Encounter for screening mammogram for malignant neoplasm of breast: Secondary | ICD-10-CM | POA: Diagnosis not present

## 2019-02-06 DIAGNOSIS — R5383 Other fatigue: Secondary | ICD-10-CM | POA: Diagnosis not present

## 2019-02-06 DIAGNOSIS — E1165 Type 2 diabetes mellitus with hyperglycemia: Secondary | ICD-10-CM | POA: Diagnosis not present

## 2019-02-11 ENCOUNTER — Other Ambulatory Visit: Payer: Self-pay | Admitting: Internal Medicine

## 2019-02-17 ENCOUNTER — Other Ambulatory Visit: Payer: Self-pay | Admitting: Internal Medicine

## 2019-02-24 DIAGNOSIS — Z23 Encounter for immunization: Secondary | ICD-10-CM | POA: Diagnosis not present

## 2019-02-24 DIAGNOSIS — S61211A Laceration without foreign body of left index finger without damage to nail, initial encounter: Secondary | ICD-10-CM | POA: Diagnosis not present

## 2019-02-24 DIAGNOSIS — I10 Essential (primary) hypertension: Secondary | ICD-10-CM | POA: Diagnosis not present

## 2019-02-24 DIAGNOSIS — Z283 Underimmunization status: Secondary | ICD-10-CM | POA: Diagnosis not present

## 2019-03-18 ENCOUNTER — Other Ambulatory Visit: Payer: Self-pay | Admitting: Internal Medicine

## 2019-04-03 ENCOUNTER — Other Ambulatory Visit: Payer: Self-pay | Admitting: Internal Medicine

## 2019-04-06 ENCOUNTER — Other Ambulatory Visit: Payer: Self-pay | Admitting: Internal Medicine

## 2019-04-14 ENCOUNTER — Other Ambulatory Visit: Payer: Self-pay | Admitting: Internal Medicine

## 2019-04-30 ENCOUNTER — Other Ambulatory Visit: Payer: Self-pay | Admitting: Internal Medicine

## 2019-05-15 ENCOUNTER — Other Ambulatory Visit: Payer: Self-pay | Admitting: Endocrinology

## 2019-06-01 ENCOUNTER — Other Ambulatory Visit: Payer: Self-pay | Admitting: Internal Medicine

## 2019-06-29 ENCOUNTER — Other Ambulatory Visit: Payer: Self-pay | Admitting: Internal Medicine

## 2019-07-01 ENCOUNTER — Other Ambulatory Visit: Payer: Self-pay | Admitting: Internal Medicine

## 2019-07-04 ENCOUNTER — Other Ambulatory Visit: Payer: Self-pay | Admitting: Internal Medicine

## 2019-07-14 ENCOUNTER — Other Ambulatory Visit: Payer: Self-pay | Admitting: Internal Medicine

## 2019-07-29 ENCOUNTER — Other Ambulatory Visit: Payer: Self-pay | Admitting: Internal Medicine

## 2019-08-16 ENCOUNTER — Other Ambulatory Visit: Payer: Self-pay | Admitting: Internal Medicine

## 2023-08-28 ENCOUNTER — Emergency Department (HOSPITAL_COMMUNITY)
Admission: EM | Admit: 2023-08-28 | Discharge: 2023-08-28 | Disposition: A | Discharge status: Home / Self Care | Attending: Emergency Medicine | Admitting: Emergency Medicine

## 2023-08-28 ENCOUNTER — Other Ambulatory Visit: Payer: Self-pay

## 2023-08-28 DIAGNOSIS — I10 Essential (primary) hypertension: Secondary | ICD-10-CM | POA: Insufficient documentation

## 2023-08-28 DIAGNOSIS — R42 Dizziness and giddiness: Secondary | ICD-10-CM | POA: Insufficient documentation

## 2023-08-28 DIAGNOSIS — R531 Weakness: Secondary | ICD-10-CM | POA: Diagnosis not present

## 2023-08-28 DIAGNOSIS — Z794 Long term (current) use of insulin: Secondary | ICD-10-CM | POA: Diagnosis not present

## 2023-08-28 DIAGNOSIS — Z79899 Other long term (current) drug therapy: Secondary | ICD-10-CM | POA: Insufficient documentation

## 2023-08-28 DIAGNOSIS — D72829 Elevated white blood cell count, unspecified: Secondary | ICD-10-CM | POA: Insufficient documentation

## 2023-08-28 DIAGNOSIS — Z7984 Long term (current) use of oral hypoglycemic drugs: Secondary | ICD-10-CM | POA: Insufficient documentation

## 2023-08-28 DIAGNOSIS — R040 Epistaxis: Secondary | ICD-10-CM | POA: Insufficient documentation

## 2023-08-28 DIAGNOSIS — E119 Type 2 diabetes mellitus without complications: Secondary | ICD-10-CM | POA: Insufficient documentation

## 2023-08-28 LAB — CBC
HCT: 35.4 % — ABNORMAL LOW (ref 36.0–46.0)
Hemoglobin: 12 g/dL (ref 12.0–15.0)
MCH: 32.2 pg (ref 26.0–34.0)
MCHC: 33.9 g/dL (ref 30.0–36.0)
MCV: 94.9 fL (ref 80.0–100.0)
Platelets: 372 10*3/uL (ref 150–400)
RBC: 3.73 MIL/uL — ABNORMAL LOW (ref 3.87–5.11)
RDW: 13.2 % (ref 11.5–15.5)
WBC: 13.9 10*3/uL — ABNORMAL HIGH (ref 4.0–10.5)
nRBC: 0 % (ref 0.0–0.2)

## 2023-08-28 LAB — BASIC METABOLIC PANEL WITH GFR
Anion gap: 16 — ABNORMAL HIGH (ref 5–15)
BUN: 19 mg/dL (ref 8–23)
CO2: 23 mmol/L (ref 22–32)
Calcium: 9.9 mg/dL (ref 8.9–10.3)
Chloride: 100 mmol/L (ref 98–111)
Creatinine, Ser: 0.82 mg/dL (ref 0.44–1.00)
GFR, Estimated: >60 mL/min (ref >60)
Glucose, Bld: 134 mg/dL — ABNORMAL HIGH (ref 70–99)
Potassium: 3.9 mmol/L (ref 3.5–5.1)
Sodium: 139 mmol/L (ref 135–145)

## 2023-08-28 LAB — TROPONIN I (HIGH SENSITIVITY): Troponin I (High Sensitivity): 3 ng/L (ref <18)

## 2023-08-28 MED ORDER — OXYMETAZOLINE HCL 0.05 % NA SOLN
1.0000 | Freq: Once | NASAL | Status: AC
  Administered 2023-08-28: 1 via NASAL
  Filled 2023-08-28: qty 30

## 2023-08-28 NOTE — ED Provider Notes (Signed)
 [facility] Provider Note   CSN: [id number] Arrival date & time: 08/28/23  6213     History  Chief Complaint  Patient presents with   Epistaxis   Weakness   Dizziness    Terry Hull is a 68 y.o. female past medical history as listed below presents with complaints of epistaxis and fatigue.  Patient states epistaxis started earlier this morning on the right nostril.  She has had nosebleeds in the past but does not get them often.  There is no injury or trauma.  She is approximately 1 week out from a dental procedure where she had all her top teeth removed.  She denies any difficulty breathing or swallowing.  No cough or congestion.  No dysuria, abdominal pain, chest pain or shortness of breath.  Although she did endorse feeling palpitations earlier this morning.  Denies any cardiac history.  She has been compliant with her amoxicillin  postoperatively.  No fevers or chills.   Epistaxis Associated symptoms: dizziness   Weakness Associated symptoms: dizziness   Dizziness Associated symptoms: weakness       Past Medical History:  Diagnosis Date   Abnormal electrocardiogram    consistent with LVH hypertrophy   ALLERGIC RHINITIS    ARTHRITIS    DIABETES MELLITUS, TYPE II    follows with endo   HYPERTENSION    HYPERTRIGLYCERIDEMIA    SLEEP APNEA    Past Surgical History:  Procedure Laterality Date   ABDOMINAL HYSTERECTOMY     HAND TENDON SURGERY     NASAL POLYP SURGERY       Home Medications Prior to Admission medications   Medication Sig Start Date End Date Taking? Authorizing Provider  albuterol  (PROVENTIL  HFA;VENTOLIN  HFA) 108 (90 BASE) MCG/ACT inhaler Inhale 2 puffs into the lungs every 6 (six) hours as needed for wheezing or shortness of breath. 10/07/13   Syliva Even, MD  amLODipine  (NORVASC ) 10 MG tablet TAKE 1 TABLET BY MOUTH DAILY 08/20/18   Colene Dauphin, MD  carvedilol  (COREG ) 6.25 MG tablet TAKE 1 TABLET BY  MOUTH TWICE DAILY WITH MEALS 06/16/18   Burns, Beckey Bourgeois, MD  citalopram  (CELEXA ) 20 MG tablet TAKE 1 TABLET BY MOUTH DAILY. Need office visit for more refills. 07/14/19   Colene Dauphin, MD  Dulaglutide  (TRULICITY ) 1.5 MG/0.5ML SOPN Inject 1.5mg  under the skin once weekly. 09/10/18   Lajean Pike, MD  fenofibrate  (TRICOR ) 145 MG tablet TAKE 1 TABLET(145 MG) BY MOUTH DAILY 09/10/18   Lajean Pike, MD  fluticasone  (FLONASE ) 50 MCG/ACT nasal spray Place 1 spray into both nostrils daily. 05/14/16   Colene Dauphin, MD  gabapentin  (NEURONTIN ) 100 MG capsule Take 1 capsule (100 mg total) by mouth 2 (two) times daily. 12/12/17   Colene Dauphin, MD  glucose blood (ONE TOUCH ULTRA TEST) test strip CHECK BLOOD SUGAR BEFORE BREAKFAST AND AT BEDTIME 09/10/18   Lajean Pike, MD  hydrochlorothiazide  (HYDRODIURIL ) 25 MG tablet TAKE 1 TABLET BY MOUTH EVERY DAY 04/03/19   Colene Dauphin, MD  Insulin  Pen Needle 31G X 5 MM MISC Use to inject insulin  twice per day as directed with rx for bByetta. DX E11.09 03/08/15   Carolene Chute, MD  lisinopril  (ZESTRIL ) 40 MG tablet Take 1 tablet (40 mg total) by mouth daily. Annual appt is due must see provider for future refills 06/01/19   Colene Dauphin, MD  metFORMIN  (GLUCOPHAGE -XR) 500 MG 24 hr tablet Take 4 tablets by  mouth daily. 09/10/18   Lajean Pike, MD  omeprazole  (PRILOSEC) 20 MG capsule TAKE 1 CAPSULE(20 MG) BY MOUTH DAILY BEFORE BREAKFAST 04/30/19   Colene Dauphin, MD  ONE TOUCH LANCETS MISC Use to help check blood sugar twice a day 11/11/14   Calone, Gregory D, FNP  pravastatin  (PRAVACHOL ) 20 MG tablet TAKE 1 TABLET BY MOUTH DAILY 04/06/19   Colene Dauphin, MD  valACYclovir  (VALTREX ) 1000 MG tablet Take 1 tablet (1,000 mg total) by mouth 3 (three) times daily. 11/26/17   Colene Dauphin, MD      Allergies    Oxycodone  and Trazodone  and nefazodone    Review of Systems   Review of Systems  HENT:  Positive for nosebleeds.   Neurological:  Positive for dizziness and weakness.     Physical Exam Updated Vital Signs BP (!) 170/120   Pulse 69   Temp 97.9 F (36.6 C) (Oral)   Resp 16   Ht 5' 6.5" (1.689 m)   Wt 76.7 kg   SpO2 100%   BMI 26.87 kg/m  Physical Exam  ED Results / Procedures / Treatments   Labs (all labs ordered are listed, but only abnormal results are displayed) Labs Reviewed  BASIC METABOLIC PANEL WITH GFR - Abnormal; Notable for the following components:      Result Value   Glucose, Bld 134 (*)    Anion gap 16 (*)    All other components within normal limits  CBC - Abnormal; Notable for the following components:   WBC 13.9 (*)    RBC 3.73 (*)    HCT 35.4 (*)    All other components within normal limits  TROPONIN I (HIGH SENSITIVITY)  TROPONIN I (HIGH SENSITIVITY)    EKG EKG Interpretation Date/Time:  Wednesday August 28 2023 09:29:23 EDT Ventricular Rate:  65 PR Interval:  185 QRS Duration:  98 QT Interval:  400 QTC Calculation: 416 R Axis:   37  Text Interpretation: Sinus rhythm Nonspecific repol abnormality, lateral leads Confirmed by Mozell Arias [phone]) on 08/28/2023 11:37:28 AM  Radiology No results found.  Procedures Procedures    Medications Ordered in ED Medications  oxymetazoline (AFRIN) 0.05 % nasal spray 1 spray (1 spray Each Nare Given 08/28/23 0955)    ED Course/ Medical Decision Making/ A&P                                 Medical Decision Making  This patient presents to the ED with chief complaint(s) of epistaxis and fatigue.  The complaint involves an extensive differential diagnosis and also carries with it a high risk of complications and morbidity.   Pertinent past medical history as listed in HPI  The differential diagnosis includes  Uncomplicated epistaxis, anemia, URI, sepsis, ACS Additional history obtained: Records reviewed Care Everywhere/External Records  Assessment and management:   Patient presents hypertensive with complaints of epistaxis that started this morning in the  right nostril.  Upon examination epistaxis has ceased since she has been applying pressure for the last 15 minutes.  She is not on blood thinners.  There is no injury or trauma.  She does endorse feeling fatigued, however has no other associated symptoms including cough, congestion, dysuria, abdominal pain, chest pain or shortness of breath.  No fevers or chills.  She has been compliant with her amoxicillin  following her dental procedure last week.  No evidence of dental abscess appreciated on exam.  She does endorse feeling palpitations, however has no cardiac history.  She did not take her blood pressure medication this morning.  Upon evaluation, patient does have some residual bleeding.  Will apply oxymetazoline soaked gauze, and have patient apply pressure for 20 minutes  Upon reassessment epistaxis has ceased.  Noted to be hypertensive today.  However patient did not take her blood pressure medications today.  She is otherwise asymptomatic and will resume her blood pressure medication upon returning home.  Independent ECG interpretation:  Sinus rhythm with nonspecific T wave abnormality  Independent labs interpretation:  The following labs were independently interpreted:  CBC with leukocytosis of 13.9, hemoglobin stable, BMP with mildly elevated anion gap otherwise no acute abnormality, trop without elevation  Independent visualization and interpretation of imaging: I independently visualized the following imaging with scope of interpretation limited to determining acute life threatening conditions related to emergency care: none    Consultations obtained:   none  Disposition:   Patient will be discharged home. The patient has been appropriately medically screened and/or stabilized in the ED. I have low suspicion for any other emergent medical condition which would require further screening, evaluation or treatment in the ED or require inpatient management. At time of discharge the patient  is hemodynamically stable and in no acute distress. I have discussed work-up results and diagnosis with patient and answered all questions. Patient is agreeable with discharge plan. We discussed strict return precautions for returning to the emergency department and they verbalized understanding.     Social Determinants of Health:   none  This note was dictated with voice recognition software.  Despite best efforts at proofreading, errors may have occurred which can change the documentation meaning.          Final Clinical Impression(s) / ED Diagnoses Final diagnoses:  Epistaxis    Rx / DC Orders ED Discharge Orders     None         Ciro Tashiro H, PA-C 08/28/23 1207    Mozell Arias, MD 08/28/23 [phone]

## 2023-08-28 NOTE — ED Triage Notes (Addendum)
 Pt. Stated, My nose started bleeding this morning around 0600 and I felt weak or a little dizzy. I had all my teeth pulled last week and have stitches there. I called my Dr. And he said to come here.. Im on antibiotics, pain, and swelling pills. Right now I feel like Im just drained.

## 2023-08-28 NOTE — Discharge Instructions (Signed)
 You were evaluated the emergency room for nosebleed, fatigue and dizziness.  Your lab work did not show any significant abnormality.  If the nosebleeds recurs please apply pressure again x 20 minutes.  You are provided a referral for a ears nose and throat doctor.  Please call make an appointment within the next week.
